# Patient Record
Sex: Female | Born: 1945
Health system: Southern US, Community
[De-identification: ages and names within clinical notes are randomized; demographics above are authoritative.]

## PROBLEM LIST (undated history)

## (undated) DIAGNOSIS — T7840XA Allergy, unspecified, initial encounter: Secondary | ICD-10-CM

## (undated) DIAGNOSIS — K219 Gastro-esophageal reflux disease without esophagitis: Secondary | ICD-10-CM

## (undated) DIAGNOSIS — I10 Essential (primary) hypertension: Secondary | ICD-10-CM

## (undated) DIAGNOSIS — H269 Unspecified cataract: Secondary | ICD-10-CM

## (undated) HISTORY — DX: Allergy, unspecified, initial encounter: T78.40XA

## (undated) HISTORY — DX: Gastro-esophageal reflux disease without esophagitis: K21.9

## (undated) HISTORY — DX: Unspecified cataract: H26.9

## (undated) HISTORY — DX: Essential (primary) hypertension: I10

---

## 1981-06-16 HISTORY — PX: PARTIAL HYSTERECTOMY: SHX80

## 2000-01-23 ENCOUNTER — Encounter: Payer: Self-pay | Admitting: Family Medicine

## 2000-01-23 ENCOUNTER — Encounter: Admission: RE | Admit: 2000-01-23 | Discharge: 2000-01-23 | Payer: Self-pay | Admitting: Family Medicine

## 2001-02-05 ENCOUNTER — Encounter: Payer: Self-pay | Admitting: Family Medicine

## 2001-02-05 ENCOUNTER — Encounter: Admission: RE | Admit: 2001-02-05 | Discharge: 2001-02-05 | Payer: Self-pay | Admitting: Family Medicine

## 2001-11-23 ENCOUNTER — Other Ambulatory Visit: Admission: RE | Admit: 2001-11-23 | Discharge: 2001-11-23 | Payer: Self-pay | Admitting: Family Medicine

## 2002-02-22 ENCOUNTER — Encounter: Admission: RE | Admit: 2002-02-22 | Discharge: 2002-02-22 | Payer: Self-pay | Admitting: Family Medicine

## 2002-02-22 ENCOUNTER — Encounter: Payer: Self-pay | Admitting: Family Medicine

## 2003-01-13 ENCOUNTER — Other Ambulatory Visit: Admission: RE | Admit: 2003-01-13 | Discharge: 2003-01-13 | Payer: Self-pay | Admitting: Family Medicine

## 2003-02-28 ENCOUNTER — Encounter: Payer: Self-pay | Admitting: Family Medicine

## 2003-02-28 ENCOUNTER — Encounter: Admission: RE | Admit: 2003-02-28 | Discharge: 2003-02-28 | Payer: Self-pay | Admitting: Family Medicine

## 2004-03-06 ENCOUNTER — Ambulatory Visit (HOSPITAL_COMMUNITY): Admission: RE | Admit: 2004-03-06 | Discharge: 2004-03-06 | Payer: Self-pay | Admitting: Family Medicine

## 2005-04-01 ENCOUNTER — Encounter: Admission: RE | Admit: 2005-04-01 | Discharge: 2005-04-01 | Payer: Self-pay | Admitting: Family Medicine

## 2005-04-04 ENCOUNTER — Encounter: Admission: RE | Admit: 2005-04-04 | Discharge: 2005-04-04 | Payer: Self-pay | Admitting: Family Medicine

## 2006-05-19 ENCOUNTER — Encounter: Admission: RE | Admit: 2006-05-19 | Discharge: 2006-05-19 | Payer: Self-pay | Admitting: Family Medicine

## 2007-04-28 ENCOUNTER — Encounter: Admission: RE | Admit: 2007-04-28 | Discharge: 2007-04-28 | Payer: Self-pay | Admitting: Family Medicine

## 2007-05-17 LAB — CONVERTED CEMR LAB

## 2007-07-20 ENCOUNTER — Encounter: Admission: RE | Admit: 2007-07-20 | Discharge: 2007-07-20 | Payer: Self-pay | Admitting: Family Medicine

## 2008-05-23 ENCOUNTER — Encounter: Payer: Self-pay | Admitting: Family Medicine

## 2009-01-03 ENCOUNTER — Ambulatory Visit: Payer: Self-pay | Admitting: Family Medicine

## 2009-01-03 DIAGNOSIS — K219 Gastro-esophageal reflux disease without esophagitis: Secondary | ICD-10-CM | POA: Insufficient documentation

## 2009-01-03 DIAGNOSIS — Z860100 Personal history of colon polyps, unspecified: Secondary | ICD-10-CM | POA: Insufficient documentation

## 2009-01-03 DIAGNOSIS — J309 Allergic rhinitis, unspecified: Secondary | ICD-10-CM | POA: Insufficient documentation

## 2009-01-03 DIAGNOSIS — L280 Lichen simplex chronicus: Secondary | ICD-10-CM

## 2009-01-03 DIAGNOSIS — I10 Essential (primary) hypertension: Secondary | ICD-10-CM | POA: Insufficient documentation

## 2009-01-08 ENCOUNTER — Encounter: Admission: RE | Admit: 2009-01-08 | Discharge: 2009-01-08 | Payer: Self-pay | Admitting: Family Medicine

## 2009-05-24 ENCOUNTER — Ambulatory Visit: Payer: Self-pay | Admitting: Family Medicine

## 2009-05-24 DIAGNOSIS — E7800 Pure hypercholesterolemia, unspecified: Secondary | ICD-10-CM | POA: Insufficient documentation

## 2009-05-25 LAB — CONVERTED CEMR LAB
Albumin: 3.2 g/dL — ABNORMAL LOW (ref 3.5–5.2)
Alkaline Phosphatase: 49 units/L (ref 39–117)
Bilirubin, Direct: 0.1 mg/dL (ref 0.0–0.3)
CO2: 30 meq/L (ref 19–32)
Calcium: 8.9 mg/dL (ref 8.4–10.5)
Creatinine, Ser: 0.8 mg/dL (ref 0.4–1.2)
GFR calc non Af Amer: 76.81 mL/min (ref >60)
Glucose, Bld: 98 mg/dL (ref 70–99)
HDL: 53.5 mg/dL (ref >39.00)
Sodium: 141 meq/L (ref 135–145)
Total Protein: 6.5 g/dL (ref 6.0–8.3)
Triglycerides: 124 mg/dL (ref 0.0–149.0)
VLDL: 24.8 mg/dL (ref 0.0–40.0)

## 2009-06-19 ENCOUNTER — Ambulatory Visit: Payer: Self-pay | Admitting: Family Medicine

## 2009-06-19 LAB — HM PAP SMEAR

## 2009-06-19 LAB — CONVERTED CEMR LAB

## 2009-11-23 ENCOUNTER — Ambulatory Visit: Payer: Self-pay | Admitting: Family Medicine

## 2009-11-23 LAB — CONVERTED CEMR LAB
Albumin: 3.7 g/dL (ref 3.5–5.2)
BUN: 17 mg/dL (ref 6–23)
Calcium: 9.5 mg/dL (ref 8.4–10.5)
Cholesterol: 166 mg/dL (ref 0–200)
Creatinine, Ser: 0.8 mg/dL (ref 0.4–1.2)
GFR calc non Af Amer: 77.81 mL/min (ref >60)
Glucose, Bld: 98 mg/dL (ref 70–99)
HDL: 44.8 mg/dL (ref >39.00)
LDL Cholesterol: 81 mg/dL (ref 0–99)
Total Bilirubin: 0.6 mg/dL (ref 0.3–1.2)
VLDL: 40 mg/dL (ref 0.0–40.0)

## 2009-11-30 ENCOUNTER — Ambulatory Visit: Payer: Self-pay | Admitting: Family Medicine

## 2010-01-28 ENCOUNTER — Encounter: Admission: RE | Admit: 2010-01-28 | Discharge: 2010-01-28 | Payer: Self-pay | Admitting: Family Medicine

## 2010-06-27 ENCOUNTER — Ambulatory Visit
Admission: RE | Admit: 2010-06-27 | Discharge: 2010-06-27 | Payer: Self-pay | Source: Home / Self Care | Attending: Family Medicine | Admitting: Family Medicine

## 2010-06-27 ENCOUNTER — Other Ambulatory Visit: Payer: Self-pay | Admitting: Family Medicine

## 2010-06-27 LAB — BASIC METABOLIC PANEL
BUN: 19 mg/dL (ref 6–23)
CO2: 33 mEq/L — ABNORMAL HIGH (ref 19–32)
Calcium: 9.4 mg/dL (ref 8.4–10.5)
Chloride: 100 mEq/L (ref 96–112)
Creatinine, Ser: 0.9 mg/dL (ref 0.4–1.2)
GFR: 70.42 mL/min (ref >60.00)
Glucose, Bld: 97 mg/dL (ref 70–99)
Potassium: 3.9 mEq/L (ref 3.5–5.1)
Sodium: 138 mEq/L (ref 135–145)

## 2010-06-27 LAB — LIPID PANEL
Cholesterol: 168 mg/dL (ref 0–200)
HDL: 44.9 mg/dL (ref >39.00)
LDL Cholesterol: 88 mg/dL (ref 0–99)
Total CHOL/HDL Ratio: 4
Triglycerides: 177 mg/dL — ABNORMAL HIGH (ref 0.0–149.0)
VLDL: 35.4 mg/dL (ref 0.0–40.0)

## 2010-06-27 LAB — HEPATIC FUNCTION PANEL
ALT: 19 U/L (ref 0–35)
AST: 20 U/L (ref 0–37)
Albumin: 3.6 g/dL (ref 3.5–5.2)
Alkaline Phosphatase: 67 U/L (ref 39–117)
Bilirubin, Direct: 0.1 mg/dL (ref 0.0–0.3)
Total Bilirubin: 0.7 mg/dL (ref 0.3–1.2)
Total Protein: 6.7 g/dL (ref 6.0–8.3)

## 2010-07-09 ENCOUNTER — Ambulatory Visit
Admission: RE | Admit: 2010-07-09 | Discharge: 2010-07-09 | Payer: Self-pay | Source: Home / Self Care | Attending: Family Medicine | Admitting: Family Medicine
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
## 2010-10-03 ENCOUNTER — Other Ambulatory Visit: Payer: Self-pay | Admitting: *Deleted

## 2010-10-03 NOTE — Telephone Encounter (Signed)
Pt is switching mail order pharmacies and needs new written 90 day scripts for estradiol, caduet, and corzide.

## 2010-10-04 MED ORDER — AMLODIPINE-ATORVASTATIN 5-20 MG PO TABS: 1.0000 | ORAL_TABLET | Freq: Every day | ORAL | Status: AC

## 2010-10-04 MED ORDER — NADOLOL-BENDROFLUMETHIAZIDE 40-5 MG PO TABS: 1.0000 | ORAL_TABLET | Freq: Every day | ORAL | Status: DC

## 2010-10-04 MED ORDER — ESTRADIOL 0.5 MG PO TABS: ORAL_TABLET | ORAL | Status: DC

## 2010-12-31 ENCOUNTER — Other Ambulatory Visit: Payer: Self-pay | Admitting: Family Medicine

## 2010-12-31 DIAGNOSIS — E7800 Pure hypercholesterolemia, unspecified: Secondary | ICD-10-CM

## 2011-01-02 ENCOUNTER — Other Ambulatory Visit (INDEPENDENT_AMBULATORY_CARE_PROVIDER_SITE_OTHER): Payer: Medicare Other

## 2011-01-02 DIAGNOSIS — E7800 Pure hypercholesterolemia, unspecified: Secondary | ICD-10-CM

## 2011-01-02 LAB — LIPID PANEL
Cholesterol: 155 mg/dL (ref 0–200)
HDL: 52.5 mg/dL (ref >39.00)
Triglycerides: 142 mg/dL (ref 0.0–149.0)
VLDL: 28.4 mg/dL (ref 0.0–40.0)

## 2011-01-06 ENCOUNTER — Encounter: Payer: Self-pay | Admitting: Family Medicine

## 2011-01-08 ENCOUNTER — Other Ambulatory Visit: Payer: Self-pay | Admitting: Family Medicine

## 2011-01-08 ENCOUNTER — Telehealth: Payer: Self-pay | Admitting: *Deleted

## 2011-01-08 ENCOUNTER — Ambulatory Visit (INDEPENDENT_AMBULATORY_CARE_PROVIDER_SITE_OTHER): Payer: Medicare Other | Admitting: Family Medicine

## 2011-01-08 ENCOUNTER — Encounter: Payer: Self-pay | Admitting: Family Medicine

## 2011-01-08 DIAGNOSIS — M67919 Unspecified disorder of synovium and tendon, unspecified shoulder: Secondary | ICD-10-CM

## 2011-01-08 DIAGNOSIS — M7540 Impingement syndrome of unspecified shoulder: Secondary | ICD-10-CM | POA: Insufficient documentation

## 2011-01-08 DIAGNOSIS — Z780 Asymptomatic menopausal state: Secondary | ICD-10-CM

## 2011-01-08 DIAGNOSIS — E7800 Pure hypercholesterolemia, unspecified: Secondary | ICD-10-CM

## 2011-01-08 DIAGNOSIS — Z1231 Encounter for screening mammogram for malignant neoplasm of breast: Secondary | ICD-10-CM

## 2011-01-08 DIAGNOSIS — I10 Essential (primary) hypertension: Secondary | ICD-10-CM

## 2011-01-08 MED ORDER — DICLOFENAC SODIUM 75 MG PO TBEC: 75.0000 mg | DELAYED_RELEASE_TABLET | Freq: Two times a day (BID) | ORAL | Status: DC

## 2011-01-08 NOTE — Telephone Encounter (Signed)
i went ot enter this but computer say order is already been placed in last 24 hours. Can you look into this cancel order and close note if it is already taken care of.

## 2011-01-08 NOTE — Progress Notes (Signed)
  Subjective:    Patient ID: Claire Lewis, female    DOB: 1946/03/02, 65 y.o.   MRN: [id number]  HPI  Hypertension:  Well controlle don caduet and corzide  Using medication without problems or lightheadedness:  None Chest pain with exertion: None Edema:None Short of breath:None  Average home BPs:117/70 Other issues:  Elevated Cholesterol: LDL at goal , 1300 on lipitor, now triglycerides are also at goal. Using medications without problems:Yes Muscle aches: None Other complaints:   Right shoulder pain x 5-6 months  Pain with lifting arm reaching to back. Dull constant daily ache. No known injury or fall.  No past shoulder issues or surgery. Ibuprofen helps temporarily.   Review of Systems  Constitutional: Negative for fever and fatigue.  HENT: Negative for ear pain.   Eyes: Negative for pain.  Respiratory: Negative for chest tightness and shortness of breath.   Cardiovascular: Negative for chest pain, palpitations and leg swelling.  Gastrointestinal: Negative for abdominal pain.  Genitourinary: Negative for dysuria.       Objective:   Physical Exam  Constitutional: Vital signs are normal. She appears well-developed and well-nourished. She is cooperative.  Non-toxic appearance. She does not appear ill. No distress.  HENT:  Head: Normocephalic.  Right Ear: Hearing, tympanic membrane, external ear and ear canal normal. Tympanic membrane is not erythematous, not retracted and not bulging.  Left Ear: Hearing, tympanic membrane, external ear and ear canal normal. Tympanic membrane is not erythematous, not retracted and not bulging.  Nose: No mucosal edema or rhinorrhea. Right sinus exhibits no maxillary sinus tenderness and no frontal sinus tenderness. Left sinus exhibits no maxillary sinus tenderness and no frontal sinus tenderness.  Mouth/Throat: Uvula is midline, oropharynx is clear and moist and mucous membranes are normal.  Eyes: Conjunctivae, EOM and lids are normal.  Pupils are equal, round, and reactive to light. No foreign bodies found.  Neck: Trachea normal and normal range of motion. Neck supple. Carotid bruit is not present. No mass and no thyromegaly present.  Cardiovascular: Normal rate, regular rhythm, S1 normal, S2 normal, normal heart sounds, intact distal pulses and normal pulses.  Exam reveals no gallop and no friction rub.   No murmur heard. Pulmonary/Chest: Effort normal and breath sounds normal. Not tachypneic. No respiratory distress. She has no decreased breath sounds. She has no wheezes. She has no rhonchi. She has no rales.  Abdominal: Soft. Normal appearance and bowel sounds are normal. There is no tenderness.  Musculoskeletal:       Right shoulder: She exhibits decreased range of motion and tenderness. She exhibits no bony tenderness and no swelling.       Right shoulder: Pain with int and ext rotation and abduction  Positive Neer's impingement sign   Neg Drop arm test.  neg crossover test.  Neurological: She is alert.  Skin: Skin is warm, dry and intact. No rash noted.  Psychiatric: Her speech is normal and behavior is normal. Judgment and thought content normal. Her mood appears not anxious. Cognition and memory are normal. She does not exhibit a depressed mood.          Assessment & Plan:

## 2011-01-08 NOTE — Assessment & Plan Note (Signed)
Well controlled. Continue current medication.  

## 2011-01-08 NOTE — Patient Instructions (Signed)
Likely rotator cuff tendonitis:  Start diclofenac twice daily ... Take daily for 3-4 days with meals then as needed.  Start gentle stretching aexercsies as in Engineering geologist.  Ice. Call in 2 weeks if not improving... Will likely have you see Dr. Patsy Lager for steroid injection at that time.

## 2011-01-08 NOTE — Telephone Encounter (Signed)
[facility] and she needs order sent to them.

## 2011-01-08 NOTE — Assessment & Plan Note (Signed)
NSAIDs, exercsies, ice. Call in 2 weeks if not improving for possible steroid injection.

## 2011-01-08 NOTE — Assessment & Plan Note (Signed)
LDL at goal and triglycerides improved. Well controlled. Continue current medication.

## 2011-01-13 NOTE — Telephone Encounter (Signed)
[facility] and Dexa.

## 2011-01-14 ENCOUNTER — Ambulatory Visit: Payer: Self-pay | Admitting: Family Medicine

## 2011-01-20 ENCOUNTER — Telehealth: Payer: Self-pay | Admitting: *Deleted

## 2011-01-20 MED ORDER — NAPROXEN 500 MG PO TABS: 500.0000 mg | ORAL_TABLET | Freq: Two times a day (BID) | ORAL | Status: DC

## 2011-01-20 NOTE — Telephone Encounter (Signed)
Patient was started on Diclofenac on 01/08/2011 and took the medication with a meal as instructed but her stomach was very upset all week with diarrhea.  Could she be given something not so strong?

## 2011-01-20 NOTE — Telephone Encounter (Signed)
Call  This can happen sometimes with diclofenac (gi upset) Changed to naprosyn, sent to pharm

## 2011-01-20 NOTE — Telephone Encounter (Signed)
Patient advised.

## 2011-01-30 ENCOUNTER — Ambulatory Visit
Admission: RE | Admit: 2011-01-30 | Discharge: 2011-01-30 | Disposition: A | Discharge status: Home / Self Care | Payer: Medicare Other | Source: Ambulatory Visit | Attending: Family Medicine | Admitting: Family Medicine

## 2011-01-30 DIAGNOSIS — Z780 Asymptomatic menopausal state: Secondary | ICD-10-CM

## 2011-01-30 DIAGNOSIS — Z1231 Encounter for screening mammogram for malignant neoplasm of breast: Secondary | ICD-10-CM

## 2011-01-31 ENCOUNTER — Encounter: Payer: Self-pay | Admitting: Family Medicine

## 2011-01-31 NOTE — Telephone Encounter (Signed)
Claire Lewis, this has been taken care of?

## 2011-01-31 NOTE — Telephone Encounter (Signed)
Patient has had the bone density and mammogram as requested.

## 2011-02-07 ENCOUNTER — Encounter: Payer: Self-pay | Admitting: *Deleted

## 2011-04-15 ENCOUNTER — Telehealth: Payer: Self-pay | Admitting: *Deleted

## 2011-04-15 NOTE — Telephone Encounter (Signed)
Pt states nadolol is on manufacturer back order and unable to find, so she needs to change this to something else.  She would want that sent to [facility], prefers generic in something.  She has enough nadolol to last 3 days.

## 2011-04-16 MED ORDER — METOPROLOL SUCCINATE ER 50 MG PO TB24: 50.0000 mg | ORAL_TABLET | Freq: Every day | ORAL | Status: DC

## 2011-04-16 NOTE — Telephone Encounter (Signed)
Patient advised and she has not had any problems in the past and will watch her bp

## 2011-04-16 NOTE — Telephone Encounter (Signed)
For BP control only.. Will cahnge to metoprolol  50 mg daily unless pt has had problem with this med in past.. Please verify. HAve her watch BP closely on new med.

## 2011-06-13 ENCOUNTER — Other Ambulatory Visit: Payer: Self-pay | Admitting: Family Medicine

## 2011-07-02 ENCOUNTER — Telehealth: Payer: Self-pay | Admitting: Family Medicine

## 2011-07-02 DIAGNOSIS — I10 Essential (primary) hypertension: Secondary | ICD-10-CM

## 2011-07-02 DIAGNOSIS — E7800 Pure hypercholesterolemia, unspecified: Secondary | ICD-10-CM

## 2011-07-02 NOTE — Telephone Encounter (Signed)
Message copied by Excell Seltzer on Wed Jul 02, 2011 11:41 PM ------      Message from: Baldomero Lamy      Created: Wed Jun 25, 2011  8:23 AM      Regarding: cpx labs Thurs1/17/13       Please order  future cpx labs for pt's upcomming lab appt.      Thanks      Rodney Booze

## 2011-07-03 ENCOUNTER — Other Ambulatory Visit (INDEPENDENT_AMBULATORY_CARE_PROVIDER_SITE_OTHER): Payer: Medicare Other

## 2011-07-03 DIAGNOSIS — E7800 Pure hypercholesterolemia, unspecified: Secondary | ICD-10-CM

## 2011-07-03 DIAGNOSIS — I10 Essential (primary) hypertension: Secondary | ICD-10-CM

## 2011-07-03 LAB — COMPREHENSIVE METABOLIC PANEL
Albumin: 3.7 g/dL (ref 3.5–5.2)
Alkaline Phosphatase: 66 U/L (ref 39–117)
CO2: 28 mEq/L (ref 19–32)
Calcium: 9.1 mg/dL (ref 8.4–10.5)
Chloride: 106 mEq/L (ref 96–112)
Glucose, Bld: 89 mg/dL (ref 70–99)
Potassium: 3.8 mEq/L (ref 3.5–5.1)
Sodium: 141 mEq/L (ref 135–145)
Total Protein: 6.6 g/dL (ref 6.0–8.3)

## 2011-07-03 LAB — LIPID PANEL
Cholesterol: 149 mg/dL (ref 0–200)
LDL Cholesterol: 71 mg/dL (ref 0–99)

## 2011-07-10 ENCOUNTER — Encounter: Payer: Medicare Other | Admitting: Family Medicine

## 2011-07-14 ENCOUNTER — Encounter: Payer: Medicare Other | Admitting: Family Medicine

## 2011-07-15 ENCOUNTER — Encounter: Payer: Self-pay | Admitting: Family Medicine

## 2011-07-15 ENCOUNTER — Ambulatory Visit (INDEPENDENT_AMBULATORY_CARE_PROVIDER_SITE_OTHER): Payer: Medicare Other | Admitting: Family Medicine

## 2011-07-15 DIAGNOSIS — I10 Essential (primary) hypertension: Secondary | ICD-10-CM

## 2011-07-15 DIAGNOSIS — Z0000 Encounter for general adult medical examination without abnormal findings: Secondary | ICD-10-CM

## 2011-07-15 DIAGNOSIS — E7800 Pure hypercholesterolemia, unspecified: Secondary | ICD-10-CM

## 2011-07-15 MED ORDER — METOPROLOL SUCCINATE ER 50 MG PO TB24: 50.0000 mg | ORAL_TABLET | Freq: Every day | ORAL | Status: DC

## 2011-07-15 NOTE — Assessment & Plan Note (Signed)
Well controlled. Continue current medication, atorvastatin 20 mg daily. No SE.

## 2011-07-15 NOTE — Assessment & Plan Note (Signed)
Well controlled. Continue current medication.  

## 2011-07-15 NOTE — Progress Notes (Signed)
Subjective:    Patient ID: Claire Lewis, female    DOB: 12/07/1945, 66 y.o.   MRN: [id number]  HPI  I have personally reviewed the Medicare Annual Wellness questionnaire and have noted 1. The patient's medical and social history 2. Their use of alcohol, tobacco or illicit drugs 3. Their current medications and supplements 4. The patient's functional ability including ADL's, fall risks, home safety risks and hearing or visual             impairment. 5. Diet and physical activities 6. Evidence for depression or mood disorders  The patients weight, height, BMI and visual acuity have been recorded in the chart I have made referrals, counseling and provided education to the patient based review of the above and I have provided the pt with a written personalized care plan for preventive services.  Hypertension:   well controlled on caduet and metoprolol  Using medication without problems or lightheadedness:  HR on low side but pt asymptomatic. She will call if lightheaded Chest pain with exertion:None Edema:None Short of breath:None Average home BPs: 120-130/60-70 at home Other issues:  Elevated Cholesterol: Well controlled on on caduet Using medications without problems: None Muscle aches: None Diet compliance: Great .. ahs lost 12 lbs in last year on weight watchers. 50 lbs lost in last several years. Exercise: 3-4 times a week. Other complaints:   Review of Systems  Constitutional: Negative for fever, fatigue and unexpected weight change.  HENT: Negative for ear pain, congestion, sore throat, sneezing, trouble swallowing and sinus pressure.   Eyes: Negative for pain and itching.  Respiratory: Negative for cough, shortness of breath and wheezing.   Cardiovascular: Negative for chest pain, palpitations and leg swelling.  Gastrointestinal: Negative for nausea, abdominal pain, diarrhea, constipation and blood in stool.  Genitourinary: Negative for dysuria, hematuria, vaginal  discharge, difficulty urinating and menstrual problem.  Musculoskeletal:       Right Rotator cuff issue, improved with steroid shot with Dr. Amie Critchley in Nov 2012, still some pain with ROM.  Skin: Negative for rash.  Neurological: Negative for syncope, weakness, light-headedness, numbness and headaches.  Psychiatric/Behavioral: Negative for confusion and dysphoric mood. The patient is not nervous/anxious.        Objective:   Physical Exam  Constitutional: Vital signs are normal. She appears well-developed and well-nourished. She is cooperative.  Non-toxic appearance. She does not appear ill. No distress.  HENT:  Head: Normocephalic.  Right Ear: Hearing, tympanic membrane, external ear and ear canal normal.  Left Ear: Hearing, tympanic membrane, external ear and ear canal normal.  Nose: Nose normal.  Eyes: Conjunctivae, EOM and lids are normal. Pupils are equal, round, and reactive to light. No foreign bodies found.  Neck: Trachea normal and normal range of motion. Neck supple. Carotid bruit is not present. No mass and no thyromegaly present.  Cardiovascular: Normal rate, regular rhythm, S1 normal, S2 normal, normal heart sounds and intact distal pulses.  Exam reveals no gallop.   No murmur heard. Pulmonary/Chest: Effort normal and breath sounds normal. No respiratory distress. She has no wheezes. She has no rhonchi. She has no rales.  Abdominal: Soft. Normal appearance and bowel sounds are normal. She exhibits no distension, no fluid wave, no abdominal bruit and no mass. There is no hepatosplenomegaly. There is no tenderness. There is no rebound, no guarding and no CVA tenderness. No hernia.  Genitourinary: No breast swelling, tenderness, discharge or bleeding. Pelvic exam was performed with patient prone.  NO DVE done  Lymphadenopathy:    She has no cervical adenopathy.    She has no axillary adenopathy.  Neurological: She is alert. She has normal strength. No cranial nerve deficit  or sensory deficit.  Skin: Skin is warm, dry and intact. No rash noted.  Psychiatric: Her speech is normal and behavior is normal. Judgment normal. Her mood appears not anxious. Cognition and memory are normal. She does not exhibit a depressed mood.          Assessment & Plan:  Annual Medicare Wellness: The patient's preventative maintenance and recommended screening tests for an annual wellness exam were reviewed in full today. Brought up to date unless services declined.  Counselled on the importance of diet, exercise, and its role in overall health and mortality. The patient's FH and SH was reviewed, including their home life, tobacco status, and drug and alcohol status.   Vaccines: Uptodate with flu, Td, shingles, offered PNA vaccine but pt declined at this time. DEXA: stable 2012, repeat in 2014, On HRT. Mammogram: Nml 01/2011 DVE/PAP : TAH but has one ovary... Will check DVE every 2- 3 years. No symptoms, no family histroy of ovarian cancer. Nml DVE exam last year... Not performed today. Colon:05/2007 polyps, repeat planned in 05/2012 or early 2014.

## 2011-07-24 ENCOUNTER — Encounter: Payer: Medicare Other | Admitting: Family Medicine

## 2011-10-08 ENCOUNTER — Other Ambulatory Visit: Payer: Self-pay | Admitting: Family Medicine

## 2011-12-23 ENCOUNTER — Encounter: Payer: Medicare Other | Admitting: Family Medicine

## 2011-12-31 ENCOUNTER — Other Ambulatory Visit: Payer: Self-pay | Admitting: Family Medicine

## 2011-12-31 DIAGNOSIS — Z1231 Encounter for screening mammogram for malignant neoplasm of breast: Secondary | ICD-10-CM

## 2012-01-01 ENCOUNTER — Other Ambulatory Visit: Payer: Self-pay | Admitting: Family Medicine

## 2012-02-04 ENCOUNTER — Ambulatory Visit: Payer: Medicare Other

## 2012-03-04 ENCOUNTER — Ambulatory Visit
Admission: RE | Admit: 2012-03-04 | Discharge: 2012-03-04 | Disposition: A | Discharge status: Home / Self Care | Payer: Medicare Other | Source: Ambulatory Visit | Attending: Family Medicine | Admitting: Family Medicine

## 2012-03-04 DIAGNOSIS — Z1231 Encounter for screening mammogram for malignant neoplasm of breast: Secondary | ICD-10-CM | POA: Diagnosis not present

## 2012-04-09 DIAGNOSIS — Z23 Encounter for immunization: Secondary | ICD-10-CM | POA: Diagnosis not present

## 2012-07-06 ENCOUNTER — Other Ambulatory Visit: Payer: Self-pay | Admitting: *Deleted

## 2012-07-06 MED ORDER — ESTRADIOL 0.5 MG PO TABS: 0.5000 mg | ORAL_TABLET | ORAL | Status: DC

## 2012-07-06 MED ORDER — AMLODIPINE-ATORVASTATIN 5-20 MG PO TABS: 1.0000 | ORAL_TABLET | Freq: Every day | ORAL | Status: DC

## 2012-07-06 MED ORDER — METOPROLOL SUCCINATE ER 50 MG PO TB24: 50.0000 mg | ORAL_TABLET | Freq: Every day | ORAL | Status: DC

## 2012-08-12 ENCOUNTER — Telehealth: Payer: Self-pay | Admitting: Family Medicine

## 2012-08-12 ENCOUNTER — Other Ambulatory Visit (INDEPENDENT_AMBULATORY_CARE_PROVIDER_SITE_OTHER): Payer: Medicare Other

## 2012-08-12 DIAGNOSIS — E7800 Pure hypercholesterolemia, unspecified: Secondary | ICD-10-CM | POA: Diagnosis not present

## 2012-08-12 DIAGNOSIS — I10 Essential (primary) hypertension: Secondary | ICD-10-CM

## 2012-08-12 LAB — COMPREHENSIVE METABOLIC PANEL
CO2: 28 mEq/L (ref 19–32)
Calcium: 9.3 mg/dL (ref 8.4–10.5)
Chloride: 105 mEq/L (ref 96–112)
Creatinine, Ser: 0.8 mg/dL (ref 0.4–1.2)
GFR: 79.48 mL/min (ref >60.00)
Glucose, Bld: 101 mg/dL — ABNORMAL HIGH (ref 70–99)
Sodium: 141 mEq/L (ref 135–145)
Total Bilirubin: 0.7 mg/dL (ref 0.3–1.2)
Total Protein: 7.1 g/dL (ref 6.0–8.3)

## 2012-08-12 LAB — LIPID PANEL
Cholesterol: 155 mg/dL (ref 0–200)
VLDL: 16 mg/dL (ref 0.0–40.0)

## 2012-08-12 NOTE — Telephone Encounter (Signed)
Message copied by Kerby Nora E on Thu Aug 12, 2012  8:21 AM ------      Message from: [place], [place] J      Created: Wed Aug 04, 2012 11:23 AM      Regarding: Lab orders for Thursday, 2.27.14       Patient is scheduled for CPX labs, please order future labs, Thanks , Terri       ------

## 2012-08-20 ENCOUNTER — Encounter: Payer: Medicare Other | Admitting: Family Medicine

## 2012-08-24 ENCOUNTER — Encounter: Payer: Self-pay | Admitting: Family Medicine

## 2012-08-24 ENCOUNTER — Ambulatory Visit (INDEPENDENT_AMBULATORY_CARE_PROVIDER_SITE_OTHER): Payer: Medicare Other | Admitting: Family Medicine

## 2012-08-24 VITALS — BP 130/70 | HR 56 | Temp 98.5°F | Ht 67.5 in | Wt 189.0 lb

## 2012-08-24 DIAGNOSIS — I10 Essential (primary) hypertension: Secondary | ICD-10-CM

## 2012-08-24 NOTE — Assessment & Plan Note (Signed)
Well controlled. Continue current medication.  

## 2012-08-24 NOTE — Progress Notes (Signed)
I have personally reviewed the Medicare Annual Wellness questionnaire and have noted  1. The patient's medical and social history  2. Their use of alcohol, tobacco or illicit drugs  3. Their current medications and supplements  4. The patient's functional ability including ADL's, fall risks, home safety risks and hearing or visual  impairment.  5. Diet and physical activities  6. Evidence for depression or mood disorders  The patients weight, height, BMI and visual acuity have been recorded in the chart  I have made referrals, counseling and provided education to the patient based review of the above and I have provided the pt with a written personalized care plan for preventive services.   Hypertension: well controlled on caduet and metoprolol  Using medication without problems or lightheadedness: HR on low side but pt asymptomatic. She will call if lightheaded  Chest pain with exertion:None  Edema:None  Short of breath:None  Average home BPs: 120-130/60-70 at home  Other issues:   Elevated Cholesterol: Well controlled on on caduet  Using medications without problems: None  Muscle aches: None  Diet compliance: Great .Marland Kitchen Still on weight watchers. 50 lbs lost in last several years.  Wt Readings from Last 3 Encounters:  08/24/12 189 lb (85.73 kg)  07/15/11 190 lb 6.4 oz (86.365 kg)  01/08/11 195 lb 8 oz (88.678 kg)  Exercise: 3-4 times a week.  Other complaints:   Review of Systems  Constitutional: Negative for fever, fatigue and unexpected weight change.  HENT: Negative for ear pain, congestion, sore throat, sneezing, trouble swallowing and sinus pressure.  Eyes: Negative for pain and itching.  Respiratory: Negative for cough, shortness of breath and wheezing.  Cardiovascular: Negative for chest pain, palpitations and leg swelling.  Gastrointestinal: Negative for nausea, abdominal pain, diarrhea, constipation and blood in stool.  Genitourinary: Negative for dysuria, hematuria,  vaginal discharge, difficulty urinating and menstrual problem.  Musculoskeletal:  occ right shoulder pain. Skin: Negative for rash.  Neurological: Negative for syncope, weakness, light-headedness, numbness and headaches.  Psychiatric/Behavioral: Negative for confusion and dysphoric mood. The patient is not nervous/anxious.  Objective:   Physical Exam  Constitutional: Vital signs are normal. She appears well-developed and well-nourished. She is cooperative. Non-toxic appearance. She does not appear ill. No distress.  HENT:  Head: Normocephalic.  Right Ear: Hearing, tympanic membrane, external ear and ear canal normal.  Left Ear: Hearing, tympanic membrane, external ear and ear canal normal.  Nose: Nose normal.  Eyes: Conjunctivae, EOM and lids are normal. Pupils are equal, round, and reactive to light. No foreign bodies found.  Neck: Trachea normal and normal range of motion. Neck supple. Carotid bruit is not present. No mass and no thyromegaly present.  Cardiovascular: Normal rate, regular rhythm, S1 normal, S2 normal, normal heart sounds and intact distal pulses. Exam reveals no gallop.  No murmur heard.  Pulmonary/Chest: Effort normal and breath sounds normal. No respiratory distress. She has no wheezes. She has no rhonchi. She has no rales.  Abdominal: Soft. Normal appearance and bowel sounds are normal. She exhibits no distension, no fluid wave, no abdominal bruit and no mass. There is no hepatosplenomegaly. There is no tenderness. There is no rebound, no guarding and no CVA tenderness. No hernia.  Genitourinary: No breast swelling, tenderness, discharge or bleeding. Pelvic exam was performed with patient prone.  DVE: Lymphadenopathy:  She has no cervical adenopathy.  She has no axillary adenopathy.  Neurological: She is alert. She has normal strength. No cranial nerve deficit or sensory deficit.  Skin: Skin is warm, dry and intact. No rash noted.  Psychiatric: Her speech is normal and  behavior is normal. Judgment normal. Her mood appears not anxious. Cognition and memory are normal. She does not exhibit a depressed mood.  Assessment & Plan:   Annual Medicare Wellness: The patient's preventative maintenance and recommended screening tests for an annual wellness exam were reviewed in full today.  Brought up to date unless services declined.  Counselled on the importance of diet, exercise, and its role in overall health and mortality.  The patient's FH and SH was reviewed, including their home life, tobacco status, and drug and alcohol status.   Vaccines: Uptodate with flu, Td, shingles, offered PNA vaccine but pt declined at this time.  DEXA: stable, normal 2012, repeat in 2017, On HRT.  Mammogram: nml 12/2011 DVE/PAP : TAH but has one ovary... Will check DVE every 2- 3 years. No symptoms, no family histroy of ovarian cancer. Nml DVE exam 2012.Marland Kitchen Repeat today Colon:05/2007 polyps, repeat planned in 05/2012 or early 2014... Scheduled with Dr. Elnoria Howard.

## 2012-08-24 NOTE — Patient Instructions (Addendum)
Try to increase exercise or changing type of exercise. Let us know about pnemonia vaccine. Keep appt with Dr. Elnoria Howard for colonoscopy. Set up living will and HCPOA when able.

## 2012-09-07 ENCOUNTER — Other Ambulatory Visit: Payer: Self-pay | Admitting: *Deleted

## 2012-09-07 MED ORDER — METOPROLOL SUCCINATE ER 50 MG PO TB24: 50.0000 mg | ORAL_TABLET | Freq: Every day | ORAL | Status: DC

## 2012-09-07 MED ORDER — AMLODIPINE-ATORVASTATIN 5-20 MG PO TABS: 1.0000 | ORAL_TABLET | Freq: Every day | ORAL | Status: DC

## 2012-09-08 ENCOUNTER — Other Ambulatory Visit: Payer: Self-pay | Admitting: *Deleted

## 2012-09-08 MED ORDER — ESTRADIOL 0.5 MG PO TABS: 0.5000 mg | ORAL_TABLET | ORAL | Status: DC

## 2012-10-28 DIAGNOSIS — Z1211 Encounter for screening for malignant neoplasm of colon: Secondary | ICD-10-CM | POA: Diagnosis not present

## 2012-10-28 DIAGNOSIS — Z86010 Personal history of colonic polyps: Secondary | ICD-10-CM | POA: Diagnosis not present

## 2012-10-28 DIAGNOSIS — D126 Benign neoplasm of colon, unspecified: Secondary | ICD-10-CM | POA: Diagnosis not present

## 2012-10-28 DIAGNOSIS — K649 Unspecified hemorrhoids: Secondary | ICD-10-CM | POA: Diagnosis not present

## 2012-10-28 DIAGNOSIS — K5730 Diverticulosis of large intestine without perforation or abscess without bleeding: Secondary | ICD-10-CM | POA: Diagnosis not present

## 2012-10-28 LAB — HM COLONOSCOPY

## 2013-02-01 ENCOUNTER — Other Ambulatory Visit: Payer: Self-pay

## 2013-02-01 DIAGNOSIS — Z1231 Encounter for screening mammogram for malignant neoplasm of breast: Secondary | ICD-10-CM

## 2013-03-15 ENCOUNTER — Ambulatory Visit
Admission: RE | Admit: 2013-03-15 | Discharge: 2013-03-15 | Disposition: A | Discharge status: Home / Self Care | Payer: Medicare Other | Source: Ambulatory Visit

## 2013-03-15 DIAGNOSIS — Z1231 Encounter for screening mammogram for malignant neoplasm of breast: Secondary | ICD-10-CM | POA: Diagnosis not present

## 2013-03-16 DIAGNOSIS — M6530 Trigger finger, unspecified finger: Secondary | ICD-10-CM | POA: Diagnosis not present

## 2013-03-18 DIAGNOSIS — Z23 Encounter for immunization: Secondary | ICD-10-CM | POA: Diagnosis not present

## 2013-04-13 DIAGNOSIS — M6530 Trigger finger, unspecified finger: Secondary | ICD-10-CM | POA: Diagnosis not present

## 2013-09-26 ENCOUNTER — Telehealth: Payer: Self-pay | Admitting: Family Medicine

## 2013-09-26 DIAGNOSIS — E7800 Pure hypercholesterolemia, unspecified: Secondary | ICD-10-CM

## 2013-09-26 NOTE — Telephone Encounter (Signed)
Message copied by Jinny Sanders on Mon Sep 26, 2013 10:55 PM ------      Message from: Ellamae Sia      Created: Thu Sep 15, 2013 12:10 PM      Regarding: Lab orders for Tuesday, 4.14.15       Patient is scheduled for CPX labs, please order future labs, Thanks , Terri       ------

## 2013-09-27 ENCOUNTER — Other Ambulatory Visit (INDEPENDENT_AMBULATORY_CARE_PROVIDER_SITE_OTHER): Payer: Medicare Other

## 2013-09-27 DIAGNOSIS — E7800 Pure hypercholesterolemia, unspecified: Secondary | ICD-10-CM

## 2013-09-27 LAB — COMPREHENSIVE METABOLIC PANEL
ALT: 19 U/L (ref 0–35)
AST: 19 U/L (ref 0–37)
Albumin: 3.7 g/dL (ref 3.5–5.2)
Alkaline Phosphatase: 62 U/L (ref 39–117)
BILIRUBIN TOTAL: 0.5 mg/dL (ref 0.3–1.2)
BUN: 17 mg/dL (ref 6–23)
CALCIUM: 9.2 mg/dL (ref 8.4–10.5)
CHLORIDE: 108 meq/L (ref 96–112)
CO2: 25 meq/L (ref 19–32)
CREATININE: 0.7 mg/dL (ref 0.4–1.2)
GFR: 84.24 mL/min (ref >60.00)
Glucose, Bld: 97 mg/dL (ref 70–99)
Potassium: 3.9 mEq/L (ref 3.5–5.1)
Sodium: 140 mEq/L (ref 135–145)
Total Protein: 6.8 g/dL (ref 6.0–8.3)

## 2013-09-27 LAB — LIPID PANEL
CHOLESTEROL: 151 mg/dL (ref 0–200)
HDL: 48.3 mg/dL (ref >39.00)
LDL Cholesterol: 74 mg/dL (ref 0–99)
TRIGLYCERIDES: 142 mg/dL (ref 0.0–149.0)
Total CHOL/HDL Ratio: 3
VLDL: 28.4 mg/dL (ref 0.0–40.0)

## 2013-09-29 ENCOUNTER — Other Ambulatory Visit: Payer: Self-pay | Admitting: *Deleted

## 2013-09-29 MED ORDER — AMLODIPINE-ATORVASTATIN 5-20 MG PO TABS: 1.0000 | ORAL_TABLET | Freq: Every day | ORAL | Status: DC

## 2013-09-29 MED ORDER — METOPROLOL SUCCINATE ER 50 MG PO TB24: 50.0000 mg | ORAL_TABLET | Freq: Every day | ORAL | Status: DC

## 2013-09-29 MED ORDER — ESTRADIOL 0.5 MG PO TABS: 0.5000 mg | ORAL_TABLET | ORAL | Status: DC

## 2013-10-04 ENCOUNTER — Ambulatory Visit (INDEPENDENT_AMBULATORY_CARE_PROVIDER_SITE_OTHER): Payer: Medicare Other | Admitting: Family Medicine

## 2013-10-04 ENCOUNTER — Encounter: Payer: Self-pay | Admitting: Family Medicine

## 2013-10-04 VITALS — BP 126/64 | HR 56 | Temp 98.0°F | Ht 65.5 in | Wt 198.0 lb

## 2013-10-04 DIAGNOSIS — Z0000 Encounter for general adult medical examination without abnormal findings: Secondary | ICD-10-CM

## 2013-10-04 DIAGNOSIS — E7800 Pure hypercholesterolemia, unspecified: Secondary | ICD-10-CM

## 2013-10-04 DIAGNOSIS — Z23 Encounter for immunization: Secondary | ICD-10-CM

## 2013-10-04 DIAGNOSIS — I10 Essential (primary) hypertension: Secondary | ICD-10-CM

## 2013-10-04 NOTE — Patient Instructions (Addendum)
Given PNA  Vaccine today. Schedule mammogram in 01/2014. Work on low [employer], healthy eating and weight loss.

## 2013-10-04 NOTE — Progress Notes (Signed)
Pre visit review using our clinic review tool, if applicable. No additional management support is needed unless otherwise documented below in the visit note. 

## 2013-10-04 NOTE — Assessment & Plan Note (Signed)
Well controlled. Continue current medication.  

## 2013-10-04 NOTE — Assessment & Plan Note (Signed)
Well controlled 

## 2013-10-04 NOTE — Progress Notes (Signed)
I have personally reviewed the Medicare Annual Wellness questionnaire and have noted  1. The patient's medical and social history  2. Their use of alcohol, tobacco or illicit drugs  3. Their current medications and supplements  4. The patient's functional ability including ADL's, fall risks, home safety risks and hearing or visual  impairment.  5. Diet and physical activities  6. Evidence for depression or mood disorders  The patients weight, height, BMI and visual acuity have been recorded in the chart  I have made referrals, counseling and provided education to the patient based review of the above and I have provided the pt with a written personalized care plan for preventive services.   Hypertension: well controlled on caduet and metoprolol  BP Readings from Last 3 Encounters:  10/04/13 126/64  08/24/12 130/70  07/15/11 140/64  Using medication without problems or lightheadedness: HR on low side but pt asymptomatic. She will call if lightheaded  Chest pain with exertion:None  Edema:None  Short of breath:None  Average home BPs: 120-130/60-70 at home  Other issues:   Elevated Cholesterol: Well controlled on on caduet  Lab Results  Component Value Date   CHOL 151 09/27/2013   HDL 48.30 09/27/2013   LDLCALC 74 09/27/2013   TRIG 142.0 09/27/2013   CHOLHDL 3 09/27/2013  Using medications without problems: None  Muscle aches: None  Diet compliance: Great .Marland Kitchen Still on weight watchers.  Some weight gain back.  Wt Readings from Last 3 Encounters:  10/04/13 198 lb (89.812 kg)  08/24/12 189 lb (85.73 kg)  07/15/11 190 lb 6.4 oz (86.365 kg)  Exercise:  Walking 3-4 times a week.  Other complaints:   Review of Systems  Constitutional: Negative for fever, fatigue and unexpected weight change.  HENT: Negative for ear pain, congestion, sore throat, sneezing, trouble swallowing and sinus pressure.  Eyes: Negative for pain and itching.  Respiratory: Negative for cough, shortness of breath and  wheezing.  Cardiovascular: Negative for chest pain, palpitations and leg swelling.  Gastrointestinal: Negative for nausea, abdominal pain, diarrhea, constipation and blood in stool.  Genitourinary: Negative for dysuria, hematuria, vaginal discharge, difficulty urinating and menstrual problem.  Musculoskeletal: occ right shoulder pain.  Skin: Negative for rash.  Neurological: Negative for syncope, weakness, light-headedness, numbness and headaches.  Psychiatric/Behavioral: Negative for confusion and dysphoric mood. The patient is not nervous/anxious.  Objective:   Physical Exam  Constitutional: Vital signs are normal. She appears well-developed and well-nourished. She is cooperative. Non-toxic appearance. She does not appear ill. No distress.  HENT:  Head: Normocephalic.  Right Ear: Hearing, tympanic membrane, external ear and ear canal normal.  Left Ear: Hearing, tympanic membrane, external ear and ear canal normal.  Nose: Nose normal.  Eyes: Conjunctivae, EOM and lids are normal. Pupils are equal, round, and reactive to light. No foreign bodies found.  Neck: Trachea normal and normal range of motion. Neck supple. Carotid bruit is not present. No mass and no thyromegaly present.  Cardiovascular: Normal rate, regular rhythm, S1 normal, S2 normal, normal heart sounds and intact distal pulses. Exam reveals no gallop.  No murmur heard.  Pulmonary/Chest: Effort normal and breath sounds normal. No respiratory distress. She has no wheezes. She has no rhonchi. She has no rales.  Abdominal: Soft. Normal appearance and bowel sounds are normal. She exhibits no distension, no fluid wave, no abdominal bruit and no mass. There is no hepatosplenomegaly. There is no tenderness. There is no rebound, no guarding and no CVA tenderness. No hernia.  Genitourinary:  No breast swelling, tenderness, discharge or bleeding. JME:[id number]. Lymphadenopathy:  She has no cervical adenopathy.  She has no axillary  adenopathy.  Neurological: She is alert. She has normal strength. No cranial nerve deficit or sensory deficit.  Skin: Skin is warm, dry and intact. No rash noted.  Psychiatric: Her speech is normal and behavior is normal. Judgment normal. Her mood appears not anxious. Cognition and memory are normal. She does not exhibit a depressed mood.  Assessment & Plan:   Annual Medicare Wellness: The patient's preventative maintenance and recommended screening tests for an annual wellness exam were reviewed in full today.  Brought up to date unless services declined.  Counselled on the importance of diet, exercise, and its role in overall health and mortality.  The patient's FH and SH was reviewed, including their home life, tobacco status, and drug and alcohol status.   Vaccines: Uptodate with flu, Td, shingles, due for PNA. DEXA: stable, normal 2012, repeat in 2017, On HRT.  Mammogram: nml 01/2013  DVE/PAP : TAH but has one ovary... Will check DVE every 2- 3 years. No symptoms, no family histroy of ovarian cancer. Nml DVE exam 2014. Colon:05/2013 polyps, family history, repeat in 5 years, Dr. Benson Norway.  nonsmoker

## 2013-10-05 ENCOUNTER — Telehealth: Payer: Self-pay | Admitting: Family Medicine

## 2013-10-05 NOTE — Telephone Encounter (Signed)
Relevant patient education assigned to patient using Emmi. ° °

## 2013-10-13 DIAGNOSIS — M6530 Trigger finger, unspecified finger: Secondary | ICD-10-CM | POA: Diagnosis not present

## 2013-12-27 ENCOUNTER — Other Ambulatory Visit: Payer: Self-pay | Admitting: *Deleted

## 2013-12-27 MED ORDER — AMLODIPINE-ATORVASTATIN 5-20 MG PO TABS: 1.0000 | ORAL_TABLET | Freq: Every day | ORAL | Status: DC

## 2014-02-13 ENCOUNTER — Other Ambulatory Visit: Payer: Self-pay

## 2014-02-13 DIAGNOSIS — Z1231 Encounter for screening mammogram for malignant neoplasm of breast: Secondary | ICD-10-CM

## 2014-03-16 ENCOUNTER — Ambulatory Visit
Admission: RE | Admit: 2014-03-16 | Discharge: 2014-03-16 | Disposition: A | Discharge status: Home / Self Care | Payer: Medicare Other | Source: Ambulatory Visit

## 2014-03-16 ENCOUNTER — Encounter (INDEPENDENT_AMBULATORY_CARE_PROVIDER_SITE_OTHER): Payer: Self-pay

## 2014-03-16 DIAGNOSIS — Z1231 Encounter for screening mammogram for malignant neoplasm of breast: Secondary | ICD-10-CM | POA: Diagnosis not present

## 2014-04-11 DIAGNOSIS — M65341 Trigger finger, right ring finger: Secondary | ICD-10-CM | POA: Diagnosis not present

## 2014-06-27 ENCOUNTER — Other Ambulatory Visit: Payer: Self-pay | Admitting: Family Medicine

## 2014-09-25 ENCOUNTER — Other Ambulatory Visit: Payer: Self-pay | Admitting: Family Medicine

## 2014-10-29 ENCOUNTER — Telehealth: Payer: Self-pay | Admitting: Family Medicine

## 2014-10-29 DIAGNOSIS — E7800 Pure hypercholesterolemia, unspecified: Secondary | ICD-10-CM

## 2014-10-29 NOTE — Telephone Encounter (Signed)
-----   Message from Ellamae Sia sent at 10/26/2014  5:04 PM EDT ----- Regarding: Lab orders for Monday, 5.16.16 Patient is scheduled for CPX labs, please order future labs, Thanks , Karna Christmas

## 2014-10-31 ENCOUNTER — Other Ambulatory Visit (INDEPENDENT_AMBULATORY_CARE_PROVIDER_SITE_OTHER): Payer: Medicare Other

## 2014-10-31 DIAGNOSIS — E7800 Pure hypercholesterolemia, unspecified: Secondary | ICD-10-CM

## 2014-10-31 LAB — COMPREHENSIVE METABOLIC PANEL
ALT: 16 U/L (ref 0–35)
AST: 20 U/L (ref 0–37)
Albumin: 4 g/dL (ref 3.5–5.2)
Alkaline Phosphatase: 69 U/L (ref 39–117)
BILIRUBIN TOTAL: 0.6 mg/dL (ref 0.2–1.2)
BUN: 16 mg/dL (ref 6–23)
CALCIUM: 9.5 mg/dL (ref 8.4–10.5)
CO2: 29 mEq/L (ref 19–32)
Chloride: 106 mEq/L (ref 96–112)
Creatinine, Ser: 0.8 mg/dL (ref 0.40–1.20)
GFR: 75.55 mL/min (ref >60.00)
Glucose, Bld: 100 mg/dL — ABNORMAL HIGH (ref 70–99)
Potassium: 4.4 mEq/L (ref 3.5–5.1)
Sodium: 140 mEq/L (ref 135–145)
Total Protein: 6.7 g/dL (ref 6.0–8.3)

## 2014-10-31 LAB — LIPID PANEL
Cholesterol: 160 mg/dL (ref 0–200)
HDL: 51.2 mg/dL (ref >39.00)
LDL CALC: 82 mg/dL (ref 0–99)
NonHDL: 108.8
TRIGLYCERIDES: 134 mg/dL (ref 0.0–149.0)
Total CHOL/HDL Ratio: 3
VLDL: 26.8 mg/dL (ref 0.0–40.0)

## 2014-11-07 ENCOUNTER — Ambulatory Visit (INDEPENDENT_AMBULATORY_CARE_PROVIDER_SITE_OTHER): Payer: Medicare Other | Admitting: Family Medicine

## 2014-11-07 ENCOUNTER — Encounter: Payer: Self-pay | Admitting: Family Medicine

## 2014-11-07 VITALS — BP 150/70 | HR 53 | Temp 98.3°F | Ht 65.5 in | Wt 199.2 lb

## 2014-11-07 DIAGNOSIS — E66811 Obesity, class 1: Secondary | ICD-10-CM

## 2014-11-07 DIAGNOSIS — E7800 Pure hypercholesterolemia, unspecified: Secondary | ICD-10-CM

## 2014-11-07 DIAGNOSIS — Z23 Encounter for immunization: Secondary | ICD-10-CM | POA: Diagnosis not present

## 2014-11-07 DIAGNOSIS — E669 Obesity, unspecified: Secondary | ICD-10-CM | POA: Diagnosis not present

## 2014-11-07 DIAGNOSIS — Z0000 Encounter for general adult medical examination without abnormal findings: Secondary | ICD-10-CM | POA: Diagnosis not present

## 2014-11-07 DIAGNOSIS — Z7189 Other specified counseling: Secondary | ICD-10-CM

## 2014-11-07 DIAGNOSIS — I10 Essential (primary) hypertension: Secondary | ICD-10-CM

## 2014-11-07 MED ORDER — ATORVASTATIN CALCIUM 20 MG PO TABS: 20.0000 mg | ORAL_TABLET | Freq: Every day | ORAL | Status: DC

## 2014-11-07 MED ORDER — AMLODIPINE BESYLATE 5 MG PO TABS: 5.0000 mg | ORAL_TABLET | Freq: Every day | ORAL | Status: DC

## 2014-11-07 NOTE — Progress Notes (Signed)
Pre visit review using our clinic review tool, if applicable. No additional management support is needed unless otherwise documented below in the visit note. 

## 2014-11-07 NOTE — Patient Instructions (Signed)
Keep working on healthy eating and regular exercise! 

## 2014-11-07 NOTE — Progress Notes (Signed)
I have personally reviewed the Medicare Annual Wellness questionnaire and have noted 1. The patient's medical and social history 2. Their use of alcohol, tobacco or illicit drugs 3. Their current medications and supplements 4. The patient's functional ability including ADL's, fall risks, home safety risks and hearing or visual             impairment. 5. Diet and physical activities 6. Evidence for depression or mood disorders 7.         Updated provider list  The patients weight, height, BMI and visual acuity have been recorded in the chart  I have made referrals, counseling and provided education to the patient based review of the above and I have provided the pt with a written personalized care plan for preventive services.   HTN: Inadequated control today here on metoprolol and caduet. Elevation today due to white coat HTN. BP Readings from Last 3 Encounters:  11/07/14 150/70  10/04/13 126/64  08/24/12 130/70  Using medication without problems or lightheadedness: None Chest pain with exertion:None  Edema:None  Short of breath:None  Average home BPs: 125-130/60-70 at home  Other issues:   Elevated Cholesterol: Well controlled on on caduet at Goal LDL < 130. Lab Results  Component Value Date   CHOL 160 10/31/2014   HDL 51.20 10/31/2014   LDLCALC 82 10/31/2014   TRIG 134.0 10/31/2014   CHOLHDL 3 10/31/2014   Using medications without problems: None  Muscle aches: None  Diet compliance: Great .Marland Kitchen Still on weight watchers.  Wt Readings from Last 3 Encounters:  11/07/14 199 lb 4 oz (90.379 kg)  10/04/13 198 lb (89.812 kg)  08/24/12 189 lb (85.73 kg)  Exercise: Walking 3 times a week.  Other complaints: Body mass index is 32.64 kg/(m^2).   Review of Systems  Constitutional: Negative for fever, fatigue and unexpected weight change.  HENT: Negative for ear pain, congestion, sore throat, sneezing, trouble swallowing and sinus pressure.  Eyes: Negative for pain  and itching.  Respiratory: Negative for cough, shortness of breath and wheezing.  Cardiovascular: Negative for chest pain, palpitations and leg swelling.  Gastrointestinal: Negative for nausea, abdominal pain, diarrhea, constipation and blood in stool.  Genitourinary: Negative for dysuria, hematuria, vaginal discharge, difficulty urinating and menstrual problem.  Musculoskeletal: occ right shoulder pain.  Skin: Negative for rash.  Neurological: Negative for syncope, weakness, light-headedness, numbness and headaches.  Psychiatric/Behavioral: Negative for confusion and dysphoric mood. The patient is not nervous/anxious.  Objective:   Physical Exam  Constitutional: Vital signs are normal. She appears well-developed and well-nourished. She is cooperative. Non-toxic appearance. She does not appear ill. No distress.  HENT:  Head: Normocephalic.  Right Ear: Hearing, tympanic membrane, external ear and ear canal normal.  Left Ear: Hearing, tympanic membrane, external ear and ear canal normal.  Nose: Nose normal.  Eyes: Conjunctivae, EOM and lids are normal. Pupils are equal, round, and reactive to light. No foreign bodies found.  Neck: Trachea normal and normal range of motion. Neck supple. Carotid bruit is not present. No mass and no thyromegaly present.  Cardiovascular: Normal rate, regular rhythm, S1 normal, S2 normal, normal heart sounds and intact distal pulses. Exam reveals no gallop.  No murmur heard.  Pulmonary/Chest: Effort normal and breath sounds normal. No respiratory distress. She has no wheezes. She has no rhonchi. She has no rales.  Abdominal: Soft. Normal appearance and bowel sounds are normal. She exhibits no distension, no fluid wave, no abdominal bruit and no mass. There is no hepatosplenomegaly.  There is no tenderness. There is no rebound, no guarding and no CVA tenderness. No hernia.  Genitourinary: No breast swelling, tenderness, discharge or bleeding.  NWG:[id number]. Lymphadenopathy:  She has no cervical adenopathy.  She has no axillary adenopathy.  Neurological: She is alert. She has normal strength. No cranial nerve deficit or sensory deficit.  Skin: Skin is warm, dry and intact. No rash noted.  Psychiatric: Her speech is normal and behavior is normal. Judgment normal. Her mood appears not anxious. Cognition and memory are normal. She does not exhibit a depressed mood.  Assessment & Plan:   Annual Medicare Wellness: The patient's preventative maintenance and recommended screening tests for an annual wellness exam were reviewed in full today.  Brought up to date unless services declined.  Counselled on the importance of diet, exercise, and its role in overall health and mortality.  The patient's FH and SH was reviewed, including their home life, tobacco status, and drug and alcohol status.   Vaccines: Uptodate with flu, Td, PNA,shingles DEXA: stable, normal 2012, repeat in 2017, On HRT.  Mammogram: nml 03/2014 DVE/PAP : TAH but has one ovary... Will check DVE every 2- 3 years. No symptoms, no family histroy of ovarian cancer. Nml DVE exam 2014. Colon:05/2013 polyps, family history, repeat in 5 years, Dr. Benson Norway.  Nonsmoker.

## 2014-11-07 NOTE — Assessment & Plan Note (Signed)
Well controlled. Continue current medication.  

## 2014-11-07 NOTE — Assessment & Plan Note (Signed)
Not at goal today but well controlled at home.

## 2014-11-29 ENCOUNTER — Other Ambulatory Visit: Payer: Self-pay | Admitting: Family Medicine

## 2015-03-08 ENCOUNTER — Other Ambulatory Visit: Payer: Self-pay | Admitting: Family Medicine

## 2015-06-15 DIAGNOSIS — Z23 Encounter for immunization: Secondary | ICD-10-CM | POA: Diagnosis not present

## 2015-09-13 ENCOUNTER — Other Ambulatory Visit: Payer: Self-pay | Admitting: *Deleted

## 2015-09-13 MED ORDER — METOPROLOL SUCCINATE ER 50 MG PO TB24: ORAL_TABLET | ORAL | Status: DC

## 2015-09-13 MED ORDER — ATORVASTATIN CALCIUM 20 MG PO TABS: 20.0000 mg | ORAL_TABLET | Freq: Every day | ORAL | Status: DC

## 2015-09-13 MED ORDER — AMLODIPINE BESYLATE 5 MG PO TABS: 5.0000 mg | ORAL_TABLET | Freq: Every day | ORAL | Status: DC

## 2015-09-13 MED ORDER — ESTRADIOL 0.5 MG PO TABS: 0.5000 mg | ORAL_TABLET | ORAL | Status: DC

## 2015-09-13 NOTE — Telephone Encounter (Signed)
Pt left voicemail at Triage requesting Rxs to be sent to new pharmacy, done and pt notified

## 2015-09-18 ENCOUNTER — Telehealth: Payer: Self-pay

## 2015-09-18 NOTE — Telephone Encounter (Signed)
PA forms completed and faxed back to [facility] at [phone].

## 2015-09-18 NOTE — Telephone Encounter (Signed)
Left message at [facility] that no PA for this patient has been received.  Request they return my call with more information since they did not leave name of medication requiring PA.

## 2015-09-18 NOTE — Telephone Encounter (Signed)
Spoke with [employer]  They state it is the estradiol 0.5 that is requiring a PA.  I requested that they fax me over the PA forms so Dr. Randa Spike can complete them.  I ask they fax the forms to [phone].

## 2015-09-18 NOTE — Telephone Encounter (Signed)
Butch Penny from [facility] left v/m requesting status of prior auth for requested med that was previously sent to Dr Diona Browner. If not received PA request call [phone] using ref # [id number]. Did not leave name of med.

## 2015-09-20 NOTE — Telephone Encounter (Signed)
PA for Estradiol 0.5 mg tablets approved from 09/19/2015 until 06/15/2016.

## 2015-10-10 ENCOUNTER — Telehealth: Payer: Self-pay | Admitting: Family Medicine

## 2015-10-10 NOTE — Telephone Encounter (Signed)
LM fot pt to sch [facility] on 7/18 at 8:15 in addition to already scheduled labs. mn

## 2016-01-01 ENCOUNTER — Telehealth: Payer: Self-pay | Admitting: Family Medicine

## 2016-01-01 ENCOUNTER — Other Ambulatory Visit (INDEPENDENT_AMBULATORY_CARE_PROVIDER_SITE_OTHER): Payer: PPO

## 2016-01-01 DIAGNOSIS — E7800 Pure hypercholesterolemia, unspecified: Secondary | ICD-10-CM

## 2016-01-01 DIAGNOSIS — Z1159 Encounter for screening for other viral diseases: Secondary | ICD-10-CM

## 2016-01-01 LAB — LIPID PANEL
CHOLESTEROL: 159 mg/dL (ref 0–200)
HDL: 50.3 mg/dL (ref >39.00)
LDL Cholesterol: 80 mg/dL (ref 0–99)
NonHDL: 108.41
Total CHOL/HDL Ratio: 3
Triglycerides: 142 mg/dL (ref 0.0–149.0)
VLDL: 28.4 mg/dL (ref 0.0–40.0)

## 2016-01-01 LAB — COMPREHENSIVE METABOLIC PANEL
ALBUMIN: 4 g/dL (ref 3.5–5.2)
ALK PHOS: 70 U/L (ref 39–117)
ALT: 18 U/L (ref 0–35)
AST: 19 U/L (ref 0–37)
BUN: 14 mg/dL (ref 6–23)
CO2: 27 mEq/L (ref 19–32)
Calcium: 9.6 mg/dL (ref 8.4–10.5)
Chloride: 107 mEq/L (ref 96–112)
Creatinine, Ser: 0.8 mg/dL (ref 0.40–1.20)
GFR: 75.29 mL/min (ref >60.00)
Glucose, Bld: 95 mg/dL (ref 70–99)
POTASSIUM: 4 meq/L (ref 3.5–5.1)
Sodium: 141 mEq/L (ref 135–145)
Total Bilirubin: 0.6 mg/dL (ref 0.2–1.2)
Total Protein: 7 g/dL (ref 6.0–8.3)

## 2016-01-01 NOTE — Telephone Encounter (Signed)
-----   Message from Ellamae Sia sent at 12/27/2015 10:06 AM EDT ----- Regarding: Lab orders for Tuesday, 7.18.17 Patient is scheduled for CPX labs, please order future labs, Thanks , Karna Christmas

## 2016-01-02 LAB — HEPATITIS C ANTIBODY: HCV AB: NEGATIVE

## 2016-01-08 ENCOUNTER — Ambulatory Visit: Payer: Medicare Other | Admitting: Family Medicine

## 2016-01-09 ENCOUNTER — Ambulatory Visit (INDEPENDENT_AMBULATORY_CARE_PROVIDER_SITE_OTHER): Payer: PPO

## 2016-01-09 VITALS — BP 142/70 | HR 51 | Temp 98.5°F | Ht 65.5 in | Wt 198.2 lb

## 2016-01-09 DIAGNOSIS — Z0000 Encounter for general adult medical examination without abnormal findings: Secondary | ICD-10-CM | POA: Diagnosis not present

## 2016-01-09 NOTE — Patient Instructions (Signed)
Claire Lewis , Thank you for taking time to come for your Medicare Wellness Visit. I appreciate your ongoing commitment to your health goals. Please review the following plan we discussed and let me know if I can assist you in the future.   These are the goals we discussed: Goals    . Increase physical activity          Starting 01/09/2016, I will continue to exercise for at least 30 min 3 days a week.        This is a list of the screening recommended for you and due dates:  Health Maintenance  Topic Date Due  . Flu Shot  01/15/2016  . Mammogram  03/16/2016  . Colon Cancer Screening  05/17/2019  . Tetanus Vaccine  06/20/2019  . DEXA scan (bone density measurement)  Completed  . Shingles Vaccine  Completed  .  Hepatitis C: One time screening is recommended by Center for Disease Control  (CDC) for  adults born from [birth year] through 1965.   Completed  . Pneumonia vaccines  Completed   Preventive Care for Adults  A healthy lifestyle and preventive care can promote health and wellness. Preventive health guidelines for adults include the following key practices.  . A routine yearly physical is a good way to check with your health care provider about your health and preventive screening. It is a chance to share any concerns and updates on your health and to receive a thorough exam.  . Visit your dentist for a routine exam and preventive care every 6 months. Brush your teeth twice a day and floss once a day. Good oral hygiene prevents tooth decay and gum disease.  . The frequency of eye exams is based on your age, health, family medical history, use  of contact lenses, and other factors. Follow your health care provider's ecommendations for frequency of eye exams.  . Eat a healthy diet. Foods like vegetables, fruits, whole grains, low-fat dairy products, and lean protein foods contain the nutrients you need without too many calories. Decrease your intake of foods high in solid fats, added  sugars, and salt. Eat the right amount of calories for you. Get information about a proper diet from your health care provider, if necessary.  . Regular physical exercise is one of the most important things you can do for your health. Most adults should get at least 150 minutes of moderate-intensity exercise (any activity that increases your heart rate and causes you to sweat) each week. In addition, most adults need muscle-strengthening exercises on 2 or more days a week.  Silver Sneakers may be a benefit available to you. To determine eligibility, you may visit the website: www.silversneakers.com or contact program at 215-758-8567 Mon-Fri between 8AM-8PM.   . Maintain a healthy weight. The body mass index (BMI) is a screening tool to identify possible weight problems. It provides an estimate of body fat based on height and weight. Your health care provider can find your BMI and can help you achieve or maintain a healthy weight.   For adults 20 years and older: ? A BMI below 18.5 is considered underweight. ? A BMI of 18.5 to 24.9 is normal. ? A BMI of 25 to 29.9 is considered overweight. ? A BMI of 30 and above is considered obese.   . Maintain normal blood lipids and cholesterol levels by exercising and minimizing your intake of saturated fat. Eat a balanced diet with plenty of fruit and vegetables. Blood tests for lipids  and cholesterol should begin at age 38 and be repeated every 5 years. If your lipid or cholesterol levels are high, you are over 50, or you are at high risk for heart disease, you may need your cholesterol levels checked more frequently. Ongoing high lipid and cholesterol levels should be treated with medicines if diet and exercise are not working.  . If you smoke, find out from your health care provider how to quit. If you do not use tobacco, please do not start.  . If you choose to drink alcohol, please do not consume more than 2 drinks per day. One drink is considered to  be 12 ounces (355 mL) of beer, 5 ounces (148 mL) of wine, or 1.5 ounces (44 mL) of liquor.  . If you are 45-61 years old, ask your health care provider if you should take aspirin to prevent strokes.  . Use sunscreen. Apply sunscreen liberally and repeatedly throughout the day. You should seek shade when your shadow is shorter than you. Protect yourself by wearing long sleeves, pants, a wide-brimmed hat, and sunglasses year round, whenever you are outdoors.  . Once a month, do a whole body skin exam, using a mirror to look at the skin on your back. Tell your health care provider of new moles, moles that have irregular borders, moles that are larger than a pencil eraser, or moles that have changed in shape or color.

## 2016-01-09 NOTE — Progress Notes (Signed)
Subjective:   Claire Lewis is a 70 y.o. female who presents for Medicare Annual (Subsequent) preventive examination.  Review of Systems:  N/A Cardiac Risk Factors include: advanced age (>46men, >9 women);obesity (BMI >30kg/m2);dyslipidemia;hypertension     Objective:     Vitals: BP (!) 142/70 (BP Location: Left Arm, Patient Position: Sitting, Cuff Size: Normal)   Pulse (!) 51   Temp 98.5 F (36.9 C) (Oral)   Ht 5' 5.5" (1.664 m) Comment: no shoes  Wt 198 lb 4 oz (89.9 kg)   SpO2 96%   BMI 32.49 kg/m   Body mass index is 32.49 kg/m.   Tobacco History  Smoking Status  . Never Smoker  Smokeless Tobacco  . Never Used     Counseling given: No   Past Medical History:  Diagnosis Date  . Allergy   . GERD (gastroesophageal reflux disease)   . Hypertension    Past Surgical History:  Procedure Laterality Date  . PARTIAL HYSTERECTOMY  1983   one ovary remains for endometriosis   Family History  Problem Relation Age of Onset  . Heart attack Father 37  . Coronary artery disease Father   . Cancer Mother     breast  . Dementia Mother   . Cancer Brother     colon  . Cancer Paternal Grandmother     lung   History  Sexual Activity  . Sexual activity: No    Outpatient Encounter Prescriptions as of 01/09/2016  Medication Sig  . amLODipine (NORVASC) 5 MG tablet Take 1 tablet (5 mg total) by mouth daily.  Marland Kitchen atorvastatin (LIPITOR) 20 MG tablet Take 1 tablet (20 mg total) by mouth daily.  . Calcium Carbonate-Vitamin D (CALCIUM 600+D) 600-400 MG-UNIT per tablet Take 1 tablet by mouth daily.  . clobetasol ointment (TEMOVATE) 0.05 % APPLY AS NEEDED  . estradiol (ESTRACE) 0.5 MG tablet Take 1 tablet (0.5 mg total) by mouth every other day.  . metoprolol succinate (TOPROL-XL) 50 MG 24 hr tablet TAKE 1 TABLET DAILY WITH OR IMMEDIATELY FOLLOWING A MEAL.  Marland Kitchen Omega-3 Fatty Acids (FISH OIL) 1000 MG CPDR Take 1 tablet by mouth daily.   No facility-administered encounter  medications on file as of 01/09/2016.     Activities of Daily Living In your present state of health, do you have any difficulty performing the following activities: 01/09/2016  Hearing? N  Vision? N  Difficulty concentrating or making decisions? N  Walking or climbing stairs? N  Dressing or bathing? N  Doing errands, shopping? N  Preparing Food and eating ? N  Using the Toilet? N  In the past six months, have you accidently leaked urine? N  Do you have problems with loss of bowel control? N  Managing your Medications? N  Managing your Finances? N  Housekeeping or managing your Housekeeping? N  Some recent data might be hidden    Patient Care Team: Jinny Sanders, MD as PCP - General Conni Elliot, DDS as Referring Physician (Dentistry)    Assessment:     Hearing Screening   125Hz  250Hz  500Hz  1000Hz  2000Hz  3000Hz  4000Hz  6000Hz  8000Hz   Right ear:   0 0 40  0    Left ear:   0 0 0  0      Visual Acuity Screening   Right eye Left eye Both eyes  Without correction:     With correction: 20/25 20/30 20/25     Exercise Activities and Dietary recommendations Current Exercise Habits: Home exercise routine,  Type of exercise: walking, Time (Minutes): 30, Frequency (Times/Week): 3, Weekly Exercise (Minutes/Week): 90, Intensity: Moderate, Exercise limited by: None identified  Goals    . Increase physical activity          Starting 01/09/2016, I will continue to exercise for at least 30 min 3 days a week.       Fall Risk Fall Risk  01/09/2016 11/07/2014 10/04/2013 08/24/2012  Falls in the past year? No No No No   Depression Screen PHQ 2/9 Scores 01/09/2016 11/07/2014 10/04/2013 08/24/2012  PHQ - 2 Score 0 0 0 0     Cognitive Testing MMSE - Mini Mental State Exam 01/09/2016  Orientation to time 5  Orientation to Place 5  Registration 3  Attention/ Calculation 0  Recall 3  Language- name 2 objects 0  Language- repeat 1  Language- follow 3 step command 3  Language- read & follow  direction 0  Write a sentence 0  Copy design 0  Total score 20   PLEASE NOTE: A Mini-Cog screen was completed. Maximum score is 20. A value of 0 denotes this part of Folstein MMSE was not completed or the patient failed this part of the Mini-Cog screening.   Mini-Cog Screening Orientation to Time - Max 5 pts Orientation to Place - Max 5 pts Registration - Max 3 pts Recall - Max 3 pts Language Repeat - Max 1 pts Language Follow 3 Step Command - Max 3 pts  Immunization History  Administered Date(s) Administered  . Influenza Whole 04/16/2010  . Influenza, High Dose Seasonal PF 06/15/2015  . Pneumococcal Conjugate-13 11/07/2014  . Pneumococcal Polysaccharide-23 10/04/2013  . Td 06/19/2009  . Zoster 05/16/2008   Screening Tests Health Maintenance  Topic Date Due  . INFLUENZA VACCINE  01/15/2016  . MAMMOGRAM  03/16/2016  . COLONOSCOPY  05/17/2019  . TETANUS/TDAP  06/20/2019  . DEXA SCAN  Completed  . ZOSTAVAX  Completed  . Hepatitis C Screening  Completed  . PNA vac Low Risk Adult  Completed      Plan:     I have personally reviewed and addressed the Medicare Annual Wellness questionnaire and have noted the following in the patient's chart:  A. Medical and social history B. Use of alcohol, tobacco or illicit drugs  C. Current medications and supplements D. Functional ability and status E.  Nutritional status F.  Physical activity G. Advance directives H. List of other physicians I.  Hospitalizations, surgeries, and ER visits in previous 12 months J.  [place] to include hearing, vision, cognitive, depression L. Referrals and appointments - none  In addition, I have reviewed and discussed with patient certain preventive protocols, quality metrics, and best practice recommendations. A written personalized care plan for preventive services as well as general preventive health recommendations were provided to patient.  See attached scanned questionnaire for  additional information.   Signed,   Lindell Noe, MHA, BS, LPN Health Advisor

## 2016-01-09 NOTE — Progress Notes (Signed)
I reviewed health advisor's note, was available for consultation, and agree with documentation and plan.   Signed,  Virgen Belland T. Nassim Cosma, MD  

## 2016-01-09 NOTE — Progress Notes (Signed)
Pre visit review using our clinic review tool, if applicable. No additional management support is needed unless otherwise documented below in the visit note. 

## 2016-01-09 NOTE — Progress Notes (Signed)
PCP notes:   Health maintenance: No gaps identified or addressed   Abnormal screenings:   Hearing - failed   Patient concerns: None   Nurse concerns: None    Next PCP appt: 01/16/16 @ 0900

## 2016-01-16 ENCOUNTER — Encounter: Payer: Self-pay | Admitting: Family Medicine

## 2016-01-16 ENCOUNTER — Ambulatory Visit (INDEPENDENT_AMBULATORY_CARE_PROVIDER_SITE_OTHER): Payer: PPO | Admitting: Family Medicine

## 2016-01-16 VITALS — BP 130/62 | HR 54 | Temp 98.4°F | Ht 65.5 in | Wt 197.5 lb

## 2016-01-16 DIAGNOSIS — I10 Essential (primary) hypertension: Secondary | ICD-10-CM | POA: Diagnosis not present

## 2016-01-16 DIAGNOSIS — E66811 Obesity, class 1: Secondary | ICD-10-CM

## 2016-01-16 DIAGNOSIS — E7800 Pure hypercholesterolemia, unspecified: Secondary | ICD-10-CM | POA: Diagnosis not present

## 2016-01-16 DIAGNOSIS — E2839 Other primary ovarian failure: Secondary | ICD-10-CM

## 2016-01-16 DIAGNOSIS — Z1231 Encounter for screening mammogram for malignant neoplasm of breast: Secondary | ICD-10-CM | POA: Diagnosis not present

## 2016-01-16 DIAGNOSIS — E669 Obesity, unspecified: Secondary | ICD-10-CM

## 2016-01-16 NOTE — Progress Notes (Addendum)
Subjective:    Patient ID: Claire Lewis, female    DOB: 1946/04/20, 70 y.o.   MRN: [id number]  HPI   70 year old female presents  For AMW Part 2.  Earlier she saw Candis Musa, LPN for medicare wellness. Note will be reviewed in detail.  No concerns noted.  Pt doing well no complaints.  HTN: At goal  today here on metoprolol and amlodipine.  Hx white coat HTN. BP Readings from Last 3 Encounters:  01/16/16 130/62  01/09/16 (!) 142/70  11/07/14 (!) 150/70  Using medication without problems or lightheadedness: None Chest pain with exertion:None  Edema:None  Short of breath:None  Average home BPs: 1130/70 at home  Other issues:   Elevated Cholesterol: Well controlled on lipitor 20 mg at Goal LDL < 130. Using medications without problems: None Muscle aches: None Diet compliance: Great Exercise: walking 3 times a week Other complaints:   Social History /Family History/Past Medical History reviewed and updated if needed.    Review of Systems  Constitutional: Negative for fatigue and fever.  HENT: Negative for ear pain.   Eyes: Negative for pain.  Respiratory: Negative for chest tightness and shortness of breath.   Cardiovascular: Negative for chest pain, palpitations and leg swelling.  Gastrointestinal: Negative for abdominal pain.  Genitourinary: Negative for dysuria.  Psychiatric/Behavioral: Negative for behavioral problems.       Objective:   Physical Exam  Constitutional: Vital signs are normal. She appears well-developed and well-nourished. She is cooperative.  Non-toxic appearance. She does not appear ill. No distress.  HENT:  Head: Normocephalic.  Right Ear: Hearing, tympanic membrane, external ear and ear canal normal.  Left Ear: Hearing, tympanic membrane, external ear and ear canal normal.  Nose: Nose normal.  Eyes: Conjunctivae, EOM and lids are normal. Pupils are equal, round, and reactive to light. Lids are everted and swept, no foreign  bodies found.  Neck: Trachea normal and normal range of motion. Neck supple. Carotid bruit is not present. No thyroid mass and no thyromegaly present.  Cardiovascular: Normal rate, regular rhythm, S1 normal, S2 normal, normal heart sounds and intact distal pulses.  Exam reveals no gallop.   No murmur heard. Pulmonary/Chest: Effort normal and breath sounds normal. No respiratory distress. She has no wheezes. She has no rhonchi. She has no rales.  Abdominal: Soft. Normal appearance and bowel sounds are normal. She exhibits no distension, no fluid wave, no abdominal bruit and no mass. There is no hepatosplenomegaly. There is no tenderness. There is no rebound, no guarding and no CVA tenderness. No hernia.  Genitourinary: Vagina normal. No breast swelling, tenderness, discharge or bleeding. Pelvic exam was performed with patient supine. There is no rash, tenderness or lesion on the right labia. There is no rash, tenderness or lesion on the left labia. Right adnexum displays no mass, no tenderness and no fullness. Left adnexum displays no mass, no tenderness and no fullness.  Lymphadenopathy:    She has no cervical adenopathy.    She has no axillary adenopathy.  Neurological: She is alert. She has normal strength. No cranial nerve deficit or sensory deficit.  Skin: Skin is warm, dry and intact. No rash noted.  Psychiatric: Her speech is normal and behavior is normal. Judgment normal. Her mood appears not anxious. Cognition and memory are normal. She does not exhibit a depressed mood.          Assessment & Plan:  Annual Medicare Wellness: The patient's preventative maintenance and recommended screening tests  for an annual wellness exam were reviewed in full today.  Brought up to date unless services declined.  Counselled on the importance of diet, exercise, and its role in overall health and mortality.  The patient's FH and SH was reviewed, including their home life, tobacco status, and drug and  alcohol status.   Vaccines: Uptodate with flu, Td, PNA,shingles DEXA: stable, normal 2012, repeat in NOW, On HRT.  Mammogram: nml 03/2014, MGM, mother with breast cancer, due. DVE/PAP : TAH but has one ovary... Will check DVE every 2- 3 years. No symptoms, no family histroy of ovarian cancer.  Due for repeat DVE is year, 2017  Colon:05/2013 polyps, family history colon cancer brother, repeat in 66 years, Dr. Benson Norway.  Nonsmoker.  Hep c: done

## 2016-01-16 NOTE — Progress Notes (Signed)
Pre visit review using our clinic review tool, if applicable. No additional management support is needed unless otherwise documented below in the visit note. 

## 2016-01-16 NOTE — Assessment & Plan Note (Signed)
Well controlled. Continue current medication. Encouraged exercise, weight loss, healthy eating habits.  

## 2016-01-16 NOTE — Assessment & Plan Note (Signed)
Work on weight loss and healthy eating.

## 2016-01-16 NOTE — Assessment & Plan Note (Signed)
Well controlled. Continue current medication.  

## 2016-01-16 NOTE — Patient Instructions (Signed)
Keep working on healthy eating, regular exercise and weight loss.   

## 2016-03-20 ENCOUNTER — Ambulatory Visit
Admission: RE | Admit: 2016-03-20 | Discharge: 2016-03-20 | Disposition: A | Discharge status: Home / Self Care | Payer: PPO | Source: Ambulatory Visit | Attending: Family Medicine | Admitting: Family Medicine

## 2016-03-20 DIAGNOSIS — Z1231 Encounter for screening mammogram for malignant neoplasm of breast: Secondary | ICD-10-CM | POA: Diagnosis not present

## 2016-03-20 DIAGNOSIS — Z780 Asymptomatic menopausal state: Secondary | ICD-10-CM | POA: Diagnosis not present

## 2016-03-20 DIAGNOSIS — E2839 Other primary ovarian failure: Secondary | ICD-10-CM

## 2016-03-20 DIAGNOSIS — Z13820 Encounter for screening for osteoporosis: Secondary | ICD-10-CM | POA: Diagnosis not present

## 2016-03-21 ENCOUNTER — Other Ambulatory Visit: Payer: Self-pay | Admitting: Family Medicine

## 2016-06-20 ENCOUNTER — Other Ambulatory Visit: Payer: Self-pay | Admitting: Family Medicine

## 2016-09-22 ENCOUNTER — Other Ambulatory Visit: Payer: Self-pay | Admitting: Family Medicine

## 2016-10-09 ENCOUNTER — Ambulatory Visit: Payer: PPO | Admitting: Primary Care

## 2016-10-09 ENCOUNTER — Ambulatory Visit (INDEPENDENT_AMBULATORY_CARE_PROVIDER_SITE_OTHER): Payer: PPO | Admitting: Internal Medicine

## 2016-10-09 ENCOUNTER — Encounter: Payer: Self-pay | Admitting: Internal Medicine

## 2016-10-09 VITALS — BP 136/80 | HR 60 | Temp 98.0°F | Wt 193.0 lb

## 2016-10-09 DIAGNOSIS — R3915 Urgency of urination: Secondary | ICD-10-CM | POA: Diagnosis not present

## 2016-10-09 DIAGNOSIS — R350 Frequency of micturition: Secondary | ICD-10-CM | POA: Diagnosis not present

## 2016-10-09 LAB — POC URINALSYSI DIPSTICK (AUTOMATED)
Bilirubin, UA: NEGATIVE
GLUCOSE UA: NEGATIVE
Ketones, UA: NEGATIVE
NITRITE UA: NEGATIVE
Spec Grav, UA: 1.02 (ref 1.010–1.025)
UROBILINOGEN UA: 0.2 U/dL
pH, UA: 6 (ref 5.0–8.0)

## 2016-10-09 MED ORDER — NITROFURANTOIN MONOHYD MACRO 100 MG PO CAPS: 100.0000 mg | ORAL_CAPSULE | Freq: Two times a day (BID) | ORAL | 0 refills | Status: DC

## 2016-10-09 NOTE — Patient Instructions (Signed)

## 2016-10-09 NOTE — Progress Notes (Signed)
Pre visit review using our clinic review tool, if applicable. No additional management support is needed unless otherwise documented below in the visit note. 

## 2016-10-09 NOTE — Addendum Note (Signed)
Addended by: Lurlean Nanny on: 10/09/2016 03:54 PM   Modules accepted: Orders

## 2016-10-09 NOTE — Progress Notes (Signed)
HPI  Pt presents to the clinic today with c/o urinary urgency and frequency. This started 2-3 days ago. She denies dysuria or blood in her urine. She denies fever, chills, nausea or low back pain. She hs tried Ibuprofen without any relief.    Review of Systems  Past Medical History:  Diagnosis Date  . Allergy   . GERD (gastroesophageal reflux disease)   . Hypertension     Family History  Problem Relation Age of Onset  . Heart attack Father 83  . Coronary artery disease Father   . Cancer Mother     breast  . Dementia Mother   . Cancer Brother     colon  . Cancer Paternal Grandmother     lung    Social History   Social History  . Marital status: Married    Spouse name: N/A  . Number of children: N/A  . Years of education: N/A   Occupational History  . retired from [employer]   Social History Main Topics  . Smoking status: Never Smoker  . Smokeless tobacco: Never Used  . Alcohol use No  . Drug use: No  . Sexual activity: No   Other Topics Concern  . Not on file   Social History Narrative   Regular exercise---walks 3-4 times a week   Diet: fruits and veggies, no fried foods, on weight watchers                      No Known Allergies   Constitutional: Denies fever, malaise, fatigue, headache or abrupt weight changes.   GU: Pt reports urgency, frequency. Denies dysuria, burning sensation, blood in urine, odor or discharge. Skin: Denies redness, rashes, lesions or ulcercations.   No other specific complaints in a complete review of systems (except as listed in HPI above).    Objective:   Physical Exam  Pulse 60   Temp 98 F (36.7 C) (Oral)   Wt 193 lb (87.5 kg)   SpO2 98%   BMI 31.63 kg/m    Wt Readings from Last 3 Encounters:  10/09/16 193 lb (87.5 kg)  01/16/16 197 lb 8 oz (89.6 kg)  01/09/16 198 lb 4 oz (89.9 kg)    General: Appears her stated age, in NAD. Cardiovascular: Normal rate and rhythm. S1,S2 noted.    Abdomen:  Soft. Normal bowel sounds. No distention or masses noted.  Tender to palpation over the bladder area. No CVA tenderness.       Assessment & Plan:   Urgency, Frequency secondary to UTI:  Urinalysis: 3+ leuks, 3+ blood Will send urine culture eRx sent if for Macrobid 100 mg BID x 5 days OK to take AZO OTC Drink plenty of fluids  RTC as needed or if symptoms persist. Webb Silversmith, NP

## 2016-10-12 LAB — URINE CULTURE

## 2016-12-03 ENCOUNTER — Other Ambulatory Visit: Payer: Self-pay | Admitting: Family Medicine

## 2017-01-08 NOTE — Progress Notes (Signed)
Subjective:   Claire Lewis is a 71 y.o. female who presents for Medicare Annual (Subsequent) preventive examination.  Review of Systems:  No ROS.  Medicare Wellness Visit. Additional risk factors are reflected in the social history.  Cardiac Risk Factors include: advanced age (>17mn, >>84women);dyslipidemia;hypertension;obesity (BMI >30kg/m2)     Objective:     Vitals: BP 122/64 (BP Location: Left Arm, Patient Position: Sitting, Cuff Size: Large)   Pulse (!) 56   Resp 16   Ht _0  (1.676 m)   Wt 191 lb 12.8 oz (87 kg)   SpO2 96%   BMI 30.96 kg/m   Body mass index is 30.96 kg/m.   Tobacco History  Smoking Status  . Never Smoker  Smokeless Tobacco  . Never Used     Counseling given: Not Answered   Past Medical History:  Diagnosis Date  . Allergy   . GERD (gastroesophageal reflux disease)   . Hypertension    Past Surgical History:  Procedure Laterality Date  . PARTIAL HYSTERECTOMY  1983   one ovary remains for endometriosis   Family History  Problem Relation Age of Onset  . Heart attack Father 639 . Coronary artery disease Father   . Cancer Mother        breast  . Dementia Mother   . Cancer Brother        colon  . Cancer Paternal Grandmother        lung  . Meniere's disease Daughter    History  Sexual Activity  . Sexual activity: No    Outpatient Encounter Prescriptions as of 01/14/2017  Medication Sig  . amLODipine (NORVASC) 5 MG tablet Take 1 tablet (5 mg total) by mouth daily.  .Marland Kitchenatorvastatin (LIPITOR) 20 MG tablet Take 1 tablet (20 mg total) by mouth daily.  . Calcium Carbonate-Vitamin D (CALCIUM 600+D) 600-400 MG-UNIT per tablet Take 1 tablet by mouth daily.  . clobetasol ointment (TEMOVATE) 0.05 % APPLY AS NEEDED  . estradiol (ESTRACE) 0.5 MG tablet Take 1 tablet (0.5 mg total) by mouth every other day. (Patient taking differently: Take 0.5 mg by mouth every other day. )  . metoprolol succinate (TOPROL-XL) 50 MG 24 hr tablet Take 1  tablet by mouth daily with or immediately following a meal  . Omega-3 Fatty Acids (FISH OIL) 1000 MG CPDR Take 1 tablet by mouth daily.  . [DISCONTINUED] nitrofurantoin, macrocrystal-monohydrate, (MACROBID) 100 MG capsule Take 1 capsule (100 mg total) by mouth 2 (two) times daily.   No facility-administered encounter medications on file as of 01/14/2017.     Activities of Daily Living In your present state of health, do you have any difficulty performing the following activities: 01/14/2017  Hearing? N  Vision? N  Difficulty concentrating or making decisions? N  Walking or climbing stairs? N  Dressing or bathing? N  Doing errands, shopping? N  Preparing Food and eating ? N  Using the Toilet? N  In the past six months, have you accidently leaked urine? N  Do you have problems with loss of bowel control? N  Managing your Medications? N  Managing your Finances? N  Housekeeping or managing your Housekeeping? N  Some recent data might be hidden    Patient Care Team: BJinny Sanders MD as PCP - General DLevonne Lappingas Consulting Physician (Dentistry)    Assessment:    Physical assessment deferred to PCP.  Exercise Activities and Dietary recommendations Current Exercise Habits: Home exercise routine, Type of exercise:  walking, Time (Minutes): 25, Frequency (Times/Week): 3, Weekly Exercise (Minutes/Week): 75, Intensity: Mild  Goals    . Increase physical activity          Starting 01/09/2016, I will continue to exercise for at least 30 min 3 days a week.  01/14/2017, I have met my goal and I will maintain this goal.      Fall Risk Fall Risk  01/14/2017 01/09/2016 11/07/2014 10/04/2013 08/24/2012  Falls in the past year? _0    Depression Screen PHQ 2/9 Scores 01/14/2017 01/09/2016 11/07/2014 10/04/2013  PHQ - 2 Score 0 0 0 0     Cognitive Function PLEASE NOTE: A Mini-Cog screen was completed. Maximum score is 20. A value of 0 denotes this part of Folstein MMSE was not completed  or the patient failed this part of the Mini-Cog screening.   Mini-Cog Screening Orientation to Time - Max 5 pts Orientation to Place - Max 5 pts Registration - Max 3 pts Recall - Max 3 pts Language Repeat - Max 1 pts Language Follow 3 Step Command - Max 3 pts      Mini-Cog - 01/14/17 0816    Normal clock drawing test? yes   How many words correct? 3      MMSE - Mini Mental State Exam 01/14/2017 01/09/2016  Orientation to time 5 5  Orientation to Place 5 5  Registration 3 3  Attention/ Calculation 0 0  Recall 3 3  Language- name 2 objects 0 0  Language- repeat 1 1  Language- follow 3 step command 3 3  Language- read & follow direction 0 0  Write a sentence 0 0  Copy design 0 0  Total score 20 20        Immunization History  Administered Date(s) Administered  . Influenza Whole 04/16/2010  . Influenza, High Dose Seasonal PF 06/15/2015  . Pneumococcal Conjugate-13 11/07/2014  . Pneumococcal Polysaccharide-23 10/04/2013  . Td 06/19/2009  . Zoster 05/16/2008   Screening Tests Health Maintenance  Topic Date Due  . INFLUENZA VACCINE  01/14/2017  . MAMMOGRAM  03/20/2018  . COLONOSCOPY  05/17/2019  . TETANUS/TDAP  06/20/2019  . DEXA SCAN  Completed  . Hepatitis C Screening  Completed  . PNA vac Low Risk Adult  Completed      Plan:   Follow up with PCP as directed.   I have personally reviewed and noted the following in the patient's chart:   . Medical and social history . Use of alcohol, tobacco or illicit drugs  . Current medications and supplements . Functional ability and status . Nutritional status . Physical activity . Advanced directives . List of other physicians . Vitals . Screenings to include cognitive, depression, and falls . Referrals and appointments  In addition, I have reviewed and discussed with patient certain preventive protocols, quality metrics, and best practice recommendations. A written personalized care plan for preventive services  as well as general preventive health recommendations were provided to patient.     Ree Edman, RN  01/14/2017

## 2017-01-08 NOTE — Progress Notes (Signed)
PCP notes:   Health maintenance: Pt states that she is due in the spring of 2019. Dr Benson Norway.    Abnormal screenings: Hearing.   Patient concerns:  Occasional night terrors.    Nurse concerns: None.   Next PCP appt: 01/20/2017.

## 2017-01-13 ENCOUNTER — Telehealth: Payer: Self-pay | Admitting: Family Medicine

## 2017-01-13 DIAGNOSIS — E7800 Pure hypercholesterolemia, unspecified: Secondary | ICD-10-CM

## 2017-01-13 NOTE — Telephone Encounter (Signed)
-----   Message from Ellamae Sia sent at 01/05/2017 12:23 PM EDT ----- Regarding: Lab orders for Wednesday, 8.1.18  AWV lab orders, please.

## 2017-01-14 ENCOUNTER — Other Ambulatory Visit (INDEPENDENT_AMBULATORY_CARE_PROVIDER_SITE_OTHER): Payer: PPO

## 2017-01-14 ENCOUNTER — Ambulatory Visit (INDEPENDENT_AMBULATORY_CARE_PROVIDER_SITE_OTHER): Payer: PPO

## 2017-01-14 VITALS — BP 122/64 | HR 56 | Resp 16 | Ht 66.0 in | Wt 191.8 lb

## 2017-01-14 DIAGNOSIS — E7800 Pure hypercholesterolemia, unspecified: Secondary | ICD-10-CM

## 2017-01-14 DIAGNOSIS — Z0000 Encounter for general adult medical examination without abnormal findings: Secondary | ICD-10-CM

## 2017-01-14 LAB — COMPREHENSIVE METABOLIC PANEL
ALBUMIN: 3.8 g/dL (ref 3.5–5.2)
ALT: 17 U/L (ref 0–35)
AST: 19 U/L (ref 0–37)
Alkaline Phosphatase: 67 U/L (ref 39–117)
BILIRUBIN TOTAL: 0.6 mg/dL (ref 0.2–1.2)
BUN: 18 mg/dL (ref 6–23)
CALCIUM: 9.4 mg/dL (ref 8.4–10.5)
CHLORIDE: 107 meq/L (ref 96–112)
CO2: 29 meq/L (ref 19–32)
Creatinine, Ser: 0.89 mg/dL (ref 0.40–1.20)
GFR: 66.38 mL/min (ref >60.00)
Glucose, Bld: 97 mg/dL (ref 70–99)
Potassium: 4.4 mEq/L (ref 3.5–5.1)
Sodium: 140 mEq/L (ref 135–145)
Total Protein: 6.7 g/dL (ref 6.0–8.3)

## 2017-01-14 LAB — LIPID PANEL
Cholesterol: 154 mg/dL (ref 0–200)
HDL: 43.6 mg/dL (ref >39.00)
LDL Cholesterol: 78 mg/dL (ref 0–99)
NonHDL: 109.9
TRIGLYCERIDES: 158 mg/dL — AB (ref 0.0–149.0)
Total CHOL/HDL Ratio: 4
VLDL: 31.6 mg/dL (ref 0.0–40.0)

## 2017-01-14 NOTE — Patient Instructions (Addendum)
Claire Lewis ,  Bring a copy of your advance directives to your next office visit.  Thank you for taking time to come for your Medicare Wellness Visit. I appreciate your ongoing commitment to your health goals. Please review the following plan we discussed and let me know if I can assist you in the future.   These are the goals we discussed: Goals    . Increase physical activity          Starting 01/09/2016, I will continue to exercise for at least 30 min 3 days a week.        This is a list of the screening recommended for you and due dates:  Health Maintenance  Topic Date Due  . Flu Shot  01/14/2017  . Mammogram  03/20/2018  . Colon Cancer Screening  05/17/2019  . Tetanus Vaccine  06/20/2019  . DEXA scan (bone density measurement)  Completed  .  Hepatitis C: One time screening is recommended by Center for Disease Control  (CDC) for  adults born from [birth year] through 1965.   Completed  . Pneumonia vaccines  Completed   Preventive Care for Adults  A healthy lifestyle and preventive care can promote health and wellness. Preventive health guidelines for adults include the following key practices.  . A routine yearly physical is a good way to check with your health care provider about your health and preventive screening. It is a chance to share any concerns and updates on your health and to receive a thorough exam.  . Visit your dentist for a routine exam and preventive care every 6 months. Brush your teeth twice a day and floss once a day. Good oral hygiene prevents tooth decay and gum disease.  . The frequency of eye exams is based on your age, health, family medical history, use  of contact lenses, and other factors. Follow your health care provider's ecommendations for frequency of eye exams.  . Eat a healthy diet. Foods like vegetables, fruits, whole grains, low-fat dairy products, and lean protein foods contain the nutrients you need without too many calories. Decrease your  intake of foods high in solid fats, added sugars, and salt. Eat the right amount of calories for you. Get information about a proper diet from your health care provider, if necessary.  . Regular physical exercise is one of the most important things you can do for your health. Most adults should get at least 150 minutes of moderate-intensity exercise (any activity that increases your heart rate and causes you to sweat) each week. In addition, most adults need muscle-strengthening exercises on 2 or more days a week.  Silver Sneakers may be a benefit available to you. To determine eligibility, you may visit the website: www.silversneakers.com or contact program at [phone] [phone] Mon-Fri between 8AM-8PM.   . Maintain a healthy weight. The body mass index (BMI) is a screening tool to identify possible weight problems. It provides an estimate of body fat based on height and weight. Your health care provider can find your BMI and can help you achieve or maintain a healthy weight.   For adults 20 years and older: ? A BMI below 18.5 is considered underweight. ? A BMI of 18.5 to 24.9 is normal. ? A BMI of 25 to 29.9 is considered overweight. ? A BMI of 30 and above is considered obese.   . Maintain normal blood lipids and cholesterol levels by exercising and minimizing your intake of saturated fat. Eat a balanced diet with plenty  of fruit and vegetables. Blood tests for lipids and cholesterol should begin at age 51 and be repeated every 5 years. If your lipid or cholesterol levels are high, you are over 50, or you are at high risk for heart disease, you may need your cholesterol levels checked more frequently. Ongoing high lipid and cholesterol levels should be treated with medicines if diet and exercise are not working.  . If you smoke, find out from your health care provider how to quit. If you do not use tobacco, please do not start.  . If you choose to drink alcohol, please do not consume more than 2  drinks per day. One drink is considered to be 12 ounces (355 mL) of beer, 5 ounces (148 mL) of wine, or 1.5 ounces (44 mL) of liquor.  . If you are 69-54 years old, ask your health care provider if you should take aspirin to prevent strokes.  . Use sunscreen. Apply sunscreen liberally and repeatedly throughout the day. You should seek shade when your shadow is shorter than you. Protect yourself by wearing long sleeves, pants, a wide-brimmed hat, and sunglasses year round, whenever you are outdoors.  . Once a month, do a whole body skin exam, using a mirror to look at the skin on your back. Tell your health care provider of new moles, moles that have irregular borders, moles that are larger than a pencil eraser, or moles that have changed in shape or color.

## 2017-01-14 NOTE — Progress Notes (Signed)
I reviewed health advisor's note, was available for consultation, and agree with documentation and plan.  

## 2017-01-20 ENCOUNTER — Encounter: Payer: Self-pay | Admitting: Family Medicine

## 2017-01-20 ENCOUNTER — Ambulatory Visit (INDEPENDENT_AMBULATORY_CARE_PROVIDER_SITE_OTHER): Payer: PPO | Admitting: Family Medicine

## 2017-01-20 VITALS — BP 122/60 | HR 60 | Temp 99.2°F | Ht 66.0 in | Wt 192.8 lb

## 2017-01-20 DIAGNOSIS — Z0000 Encounter for general adult medical examination without abnormal findings: Secondary | ICD-10-CM

## 2017-01-20 DIAGNOSIS — I10 Essential (primary) hypertension: Secondary | ICD-10-CM

## 2017-01-20 DIAGNOSIS — E7800 Pure hypercholesterolemia, unspecified: Secondary | ICD-10-CM | POA: Diagnosis not present

## 2017-01-20 NOTE — Progress Notes (Signed)
Subjective:    Patient ID: Claire Lewis, female    DOB: 31-Aug-1945, 71 y.o.   MRN: [id number]  HPI  The patient presents for complete physical and review of chronic health problems.   The patient saw Candis Musa, LPN for medicare wellness. Note reviewed in detail and important notes copied below. Abnormal screenings: Hearing. Patient concerns:  Occasional night terrors. Happens earlier in AM. Off and on , not very bad most of the time.  Hypertension:    Good control on metoprolol and amlodipine BP Readings from Last 3 Encounters:  01/20/17 122/60  01/14/17 122/64  10/09/16 136/80  Using medication without problems or lightheadedness:  none Chest pain with exertion:none Edema: none Short of breath: none Average home BPs: not checking Other issues:  Elevated Cholesterol:  At goal on  Atorvastatin 20 mg daily. Lab Results  Component Value Date   CHOL 154 01/14/2017   HDL 43.60 01/14/2017   LDLCALC 78 01/14/2017   TRIG 158.0 (H) 01/14/2017   CHOLHDL 4 01/14/2017  Using medications without problems: none Muscle aches:  none Diet compliance: low cholesterol diet Exercise:  walking Body mass index is 31.11 kg/m. Wt Readings from Last 3 Encounters:  01/20/17 192 lb 12 oz (87.4 kg)  01/14/17 191 lb 12.8 oz (87 kg)  10/09/16 193 lb (87.5 kg)  Other complaints:  Blood pressure 122/60, pulse 60, temperature 99.2 F (37.3 C), temperature source Oral, height 5\' 6"  (1.676 m), weight 192 lb 12 oz (87.4 kg).  Social History /Family History/Past Medical History reviewed in detail and updated in EMR if needed.   Review of Systems  Constitutional: Negative for fatigue and fever.  HENT: Negative for congestion.   Eyes: Negative for pain.  Respiratory: Negative for cough and shortness of breath.   Cardiovascular: Negative for chest pain, palpitations and leg swelling.  Gastrointestinal: Negative for abdominal pain.  Genitourinary: Negative for dysuria and vaginal  bleeding.  Musculoskeletal: Negative for back pain.  Neurological: Negative for syncope, light-headedness and headaches.  Psychiatric/Behavioral: Negative for dysphoric mood.       Objective:   Physical Exam  Constitutional: Vital signs are normal. She appears well-developed and well-nourished. She is cooperative.  Non-toxic appearance. She does not appear ill. No distress.  HENT:  Head: Normocephalic.  Right Ear: Hearing, tympanic membrane, external ear and ear canal normal.  Left Ear: Hearing, tympanic membrane, external ear and ear canal normal.  Nose: Nose normal.  Eyes: Pupils are equal, round, and reactive to light. Conjunctivae, EOM and lids are normal. Lids are everted and swept, no foreign bodies found.  Neck: Trachea normal and normal range of motion. Neck supple. Carotid bruit is not present. No thyroid mass and no thyromegaly present.  Cardiovascular: Normal rate, regular rhythm, S1 normal, S2 normal, normal heart sounds and intact distal pulses.  Exam reveals no gallop.   No murmur heard. Pulmonary/Chest: Effort normal and breath sounds normal. No respiratory distress. She has no wheezes. She has no rhonchi. She has no rales.  Abdominal: Soft. Normal appearance and bowel sounds are normal. She exhibits no distension, no fluid wave, no abdominal bruit and no mass. There is no hepatosplenomegaly. There is no tenderness. There is no rebound, no guarding and no CVA tenderness. No hernia.  Lymphadenopathy:    She has no cervical adenopathy.    She has no axillary adenopathy.  Neurological: She is alert. She has normal strength. No cranial nerve deficit or sensory deficit.  Skin: Skin is warm, dry  and intact. No rash noted.  Psychiatric: Her speech is normal and behavior is normal. Judgment normal. Her mood appears not anxious. Cognition and memory are normal. She does not exhibit a depressed mood.          Assessment & Plan:  The patient's preventative maintenance and  recommended screening tests for an annual wellness exam were reviewed in full today. Brought up to date unless services declined.  Counselled on the importance of diet, exercise, and its role in overall health and mortality. The patient's FH and SH was reviewed, including their home life, tobacco status, and drug and alcohol status.   Vaccines: Uptodate with Td, PNA,shingles DEXA: stable, normal 03/2016, On HRT.  Mammogram: nml 03/2016, MGM, mother with breast cancer. DVE/PAP : TAH but has one ovary... Will check DVE every 2- 3 years. No symptoms, no family histroy of ovarian cancer.   Last done in 2017. Colon:2014 polyps, family history colon cancer brother, repeat in 68 years, Dr. Benson Norway.  Nonsmoker.  Hep c: done

## 2017-01-20 NOTE — Assessment & Plan Note (Signed)
Well controlled. Continue current medication.  

## 2017-01-22 ENCOUNTER — Encounter: Payer: Self-pay | Admitting: Family Medicine

## 2017-03-18 ENCOUNTER — Other Ambulatory Visit: Payer: Self-pay | Admitting: Family Medicine

## 2017-09-17 ENCOUNTER — Other Ambulatory Visit: Payer: Self-pay | Admitting: Family Medicine

## 2017-12-11 ENCOUNTER — Other Ambulatory Visit: Payer: Self-pay | Admitting: Family Medicine

## 2018-01-26 ENCOUNTER — Ambulatory Visit (INDEPENDENT_AMBULATORY_CARE_PROVIDER_SITE_OTHER): Payer: PPO

## 2018-01-26 ENCOUNTER — Ambulatory Visit: Payer: PPO

## 2018-01-26 VITALS — BP 136/82 | HR 63 | Temp 98.7°F | Ht 65.5 in | Wt 190.8 lb

## 2018-01-26 DIAGNOSIS — Z0000 Encounter for general adult medical examination without abnormal findings: Secondary | ICD-10-CM

## 2018-01-26 DIAGNOSIS — E7800 Pure hypercholesterolemia, unspecified: Secondary | ICD-10-CM | POA: Diagnosis not present

## 2018-01-26 DIAGNOSIS — I10 Essential (primary) hypertension: Secondary | ICD-10-CM | POA: Diagnosis not present

## 2018-01-26 LAB — COMPREHENSIVE METABOLIC PANEL
ALT: 17 U/L (ref 0–35)
AST: 20 U/L (ref 0–37)
Albumin: 3.8 g/dL (ref 3.5–5.2)
Alkaline Phosphatase: 67 U/L (ref 39–117)
BILIRUBIN TOTAL: 0.6 mg/dL (ref 0.2–1.2)
BUN: 15 mg/dL (ref 6–23)
CO2: 30 meq/L (ref 19–32)
CREATININE: 0.9 mg/dL (ref 0.40–1.20)
Calcium: 9.6 mg/dL (ref 8.4–10.5)
Chloride: 106 mEq/L (ref 96–112)
GFR: 65.34 mL/min (ref >60.00)
GLUCOSE: 99 mg/dL (ref 70–99)
Potassium: 4.3 mEq/L (ref 3.5–5.1)
Sodium: 141 mEq/L (ref 135–145)
Total Protein: 6.7 g/dL (ref 6.0–8.3)

## 2018-01-26 LAB — LIPID PANEL
CHOL/HDL RATIO: 3
Cholesterol: 148 mg/dL (ref 0–200)
HDL: 44.1 mg/dL (ref >39.00)
LDL Cholesterol: 73 mg/dL (ref 0–99)
NONHDL: 103.98
Triglycerides: 156 mg/dL — ABNORMAL HIGH (ref 0.0–149.0)
VLDL: 31.2 mg/dL (ref 0.0–40.0)

## 2018-01-26 NOTE — Patient Instructions (Signed)
Claire Lewis , Thank you for taking time to come for your Medicare Wellness Visit. I appreciate your ongoing commitment to your health goals. Please review the following plan we discussed and let me know if I can assist you in the future.   These are the goals we discussed: Goals    . Increase physical activity     Starting 01/26/2018, I will continue to exercise for at least 30 min 3 days a week.         This is a list of the screening recommended for you and due dates:  Health Maintenance  Topic Date Due  . Colon Cancer Screening  06/15/2018*  . Flu Shot  09/14/2018*  . Mammogram  03/20/2018  . Tetanus Vaccine  06/20/2019  . DEXA scan (bone density measurement)  Completed  .  Hepatitis C: One time screening is recommended by Center for Disease Control  (CDC) for  adults born from [birth year] through 1965.   Completed  . Pneumonia vaccines  Completed  *Topic was postponed. The date shown is not the original due date.   Preventive Care for Adults  A healthy lifestyle and preventive care can promote health and wellness. Preventive health guidelines for adults include the following key practices.  . A routine yearly physical is a good way to check with your health care provider about your health and preventive screening. It is a chance to share any concerns and updates on your health and to receive a thorough exam.  . Visit your dentist for a routine exam and preventive care every 6 months. Brush your teeth twice a day and floss once a day. Good oral hygiene prevents tooth decay and gum disease.  . The frequency of eye exams is based on your age, health, family medical history, use  of contact lenses, and other factors. Follow your health care provider's recommendations for frequency of eye exams.  . Eat a healthy diet. Foods like vegetables, fruits, whole grains, low-fat dairy products, and lean protein foods contain the nutrients you need without too many calories. Decrease your intake of  foods high in solid fats, added sugars, and salt. Eat the right amount of calories for you. Get information about a proper diet from your health care provider, if necessary.  . Regular physical exercise is one of the most important things you can do for your health. Most adults should get at least 150 minutes of moderate-intensity exercise (any activity that increases your heart rate and causes you to sweat) each week. In addition, most adults need muscle-strengthening exercises on 2 or more days a week.  Silver Sneakers may be a benefit available to you. To determine eligibility, you may visit the website: www.silversneakers.com or contact program at (703)396-6443 Mon-Fri between 8AM-8PM.   . Maintain a healthy weight. The body mass index (BMI) is a screening tool to identify possible weight problems. It provides an estimate of body fat based on height and weight. Your health care provider can find your BMI and can help you achieve or maintain a healthy weight.   For adults 20 years and older: ? A BMI below 18.5 is considered underweight. ? A BMI of 18.5 to 24.9 is normal. ? A BMI of 25 to 29.9 is considered overweight. ? A BMI of 30 and above is considered obese.   . Maintain normal blood lipids and cholesterol levels by exercising and minimizing your intake of saturated fat. Eat a balanced diet with plenty of fruit and vegetables. Blood  tests for lipids and cholesterol should begin at age 72 and be repeated every 5 years. If your lipid or cholesterol levels are high, you are over 50, or you are at high risk for heart disease, you may need your cholesterol levels checked more frequently. Ongoing high lipid and cholesterol levels should be treated with medicines if diet and exercise are not working.  . If you smoke, find out from your health care provider how to quit. If you do not use tobacco, please do not start.  . If you choose to drink alcohol, please do not consume more than 2 drinks per  day. One drink is considered to be 12 ounces (355 mL) of beer, 5 ounces (148 mL) of wine, or 1.5 ounces (44 mL) of liquor.  . If you are 57-23 years old, ask your health care provider if you should take aspirin to prevent strokes.  . Use sunscreen. Apply sunscreen liberally and repeatedly throughout the day. You should seek shade when your shadow is shorter than you. Protect yourself by wearing long sleeves, pants, a wide-brimmed hat, and sunglasses year round, whenever you are outdoors.  . Once a month, do a whole body skin exam, using a mirror to look at the skin on your back. Tell your health care provider of new moles, moles that have irregular borders, moles that are larger than a pencil eraser, or moles that have changed in shape or color.

## 2018-01-27 NOTE — Progress Notes (Signed)
PCP notes:   Health maintenance:  Colonoscopy - addressed Flu vaccine - addressed  Abnormal screenings:   None  Patient concerns:   None  Nurse concerns:  None  Next PCP appt:   02/02/18 @ 1000

## 2018-01-27 NOTE — Progress Notes (Signed)
I reviewed health advisor's note, was available for consultation, and agree with documentation and plan.  

## 2018-01-27 NOTE — Progress Notes (Signed)
Subjective:   Claire Lewis is a 72 y.o. female who presents for Medicare Annual (Subsequent) preventive examination.  Review of Systems:  N/A Cardiac Risk Factors include: advanced age (>55men, >23 women);dyslipidemia;hypertension;obesity (BMI >30kg/m2)     Objective:     Vitals: BP 136/82 (BP Location: Right Arm, Patient Position: Sitting, Cuff Size: Normal)   Pulse 63   Temp 98.7 F (37.1 C) (Oral)   Ht 5' 5.5" (1.664 m) Comment: no shoes  Wt 190 lb 12 oz (86.5 kg)   SpO2 97%   BMI 31.26 kg/m   Body mass index is 31.26 kg/m.  Advanced Directives 01/26/2018 01/14/2017 01/09/2016  Does Patient Have a Medical Advance Directive? No No No  Would patient like information on creating a medical advance directive? No - Patient declined No - Patient declined No - patient declined information    Tobacco Social History   Tobacco Use  Smoking Status Never Smoker  Smokeless Tobacco Never Used     Counseling given: No   Clinical Intake:  Pre-visit preparation completed: Yes  Pain : No/denies pain Pain Score: 0-No pain     Nutritional Status: BMI > 30  Obese Nutritional Risks: None Diabetes: No  How often do you need to have someone help you when you read instructions, pamphlets, or other written materials from your doctor or pharmacy?: 1 - Never What is the last grade level you completed in school?: 12th grade  Interpreter Needed?: No  Comments: pt lives with spouse Information entered by :: LPinson, LPN  Past Medical History:  Diagnosis Date  . Allergy   . GERD (gastroesophageal reflux disease)   . Hypertension    Past Surgical History:  Procedure Laterality Date  . PARTIAL HYSTERECTOMY  1983   one ovary remains for endometriosis   Family History  Problem Relation Age of Onset  . Heart attack Father 78  . Coronary artery disease Father   . Cancer Mother        breast  . Dementia Mother   . Cancer Brother        colon  . Cancer Paternal Grandmother         lung  . Meniere's disease Daughter    Social History   Socioeconomic History  . Marital status: Married    Spouse name: Not on file  . Number of children: Not on file  . Years of education: Not on file  . Highest education level: Not on file  Occupational History  . Occupation: retired from [employer], operational: UNEMPLOYED  Social Needs  . Financial resource strain: Not on file  . Food insecurity:    Worry: Not on file    Inability: Not on file  . Transportation needs:    Medical: Not on file    Non-medical: Not on file  Tobacco Use  . Smoking status: Never Smoker  . Smokeless tobacco: Never Used  Substance and Sexual Activity  . Alcohol use: No  . Drug use: No  . Sexual activity: Never  Lifestyle  . Physical activity:    Days per week: Not on file    Minutes per session: Not on file  . Stress: Not on file  Relationships  . Social connections:    Talks on phone: Not on file    Gets together: Not on file    Attends religious service: Not on file    Active member of club or organization: Not on file    Attends  meetings of clubs or organizations: Not on file    Relationship status: Not on file  Other Topics Concern  . Not on file  Social History Narrative   Regular exercise---walks 3-4 times a week   Diet: fruits and veggies, no fried foods, on weight watchers                   Outpatient Encounter Medications as of 01/26/2018  Medication Sig  . amLODipine (NORVASC) 5 MG tablet Take 1 tablet (5 mg total) by mouth daily.  Marland Kitchen atorvastatin (LIPITOR) 20 MG tablet Take 1 tablet by mouth once daily  . Calcium Carbonate-Vitamin D (CALCIUM 600+D) 600-400 MG-UNIT per tablet Take 1 tablet by mouth daily.  . clobetasol ointment (TEMOVATE) 0.05 % APPLY AS NEEDED  . estradiol (ESTRACE) 0.5 MG tablet Take 1 tablet (0.5 mg total) by mouth every other day.  . metoprolol succinate (TOPROL-XL) 50 MG 24 hr tablet Take 1 tablet by mouth daily with or immediately  following a meal  . Omega-3 Fatty Acids (FISH OIL) 1000 MG CPDR Take 1 tablet by mouth daily.   No facility-administered encounter medications on file as of 01/26/2018.     Activities of Daily Living In your present state of health, do you have any difficulty performing the following activities: 01/26/2018  Hearing? N  Vision? N  Difficulty concentrating or making decisions? N  Walking or climbing stairs? N  Dressing or bathing? N  Doing errands, shopping? N  Preparing Food and eating ? N  Using the Toilet? N  In the past six months, have you accidently leaked urine? N  Do you have problems with loss of bowel control? N  Managing your Medications? N  Managing your Finances? N  Housekeeping or managing your Housekeeping? N  Some recent data might be hidden    Patient Care Team: Jinny Sanders, MD as PCP - General Levonne Lapping as Consulting Physician (Dentistry)    Assessment:   This is a routine wellness examination for Zoria.   Hearing Screening   125Hz  250Hz  500Hz  1000Hz  2000Hz  3000Hz  4000Hz  6000Hz  8000Hz   Right ear:   40 40 40  40    Left ear:   40 40 40  40      Visual Acuity Screening   Right eye Left eye Both eyes  Without correction: 20/25-1 20/25-1 20/25  With correction:       Exercise Activities and Dietary recommendations Current Exercise Habits: Home exercise routine, Type of exercise: walking, Time (Minutes): 30, Frequency (Times/Week): 3, Weekly Exercise (Minutes/Week): 90, Intensity: Moderate  Goals    . Increase physical activity     Starting 01/26/2018, I will continue to exercise for at least 30 min 3 days a week.         Fall Risk Fall Risk  01/26/2018 01/14/2017 01/09/2016 11/07/2014 10/04/2013  Falls in the past year? No No No No No   Depression Screen PHQ 2/9 Scores 01/26/2018 01/14/2017 01/09/2016 11/07/2014  PHQ - 2 Score 0 0 0 0  PHQ- 9 Score 0 - - -     Cognitive Function MMSE - Mini Mental State Exam 01/26/2018 01/14/2017 01/09/2016  Orientation  to time 5 5 5   Orientation to Place 5 5 5   Registration 3 3 3   Attention/ Calculation 0 0 0  Recall 3 3 3   Language- name 2 objects 0 0 0  Language- repeat 1 1 1   Language- follow 3 step command 3 3 3   Language- read &  follow direction 0 0 0  Write a sentence 0 0 0  Copy design 0 0 0  Total score 20 20 20      PLEASE NOTE: A Mini-Cog screen was completed. Maximum score is 20. A value of 0 denotes this part of Folstein MMSE was not completed or the patient failed this part of the Mini-Cog screening.   Mini-Cog Screening Orientation to Time - Max 5 pts Orientation to Place - Max 5 pts Registration - Max 3 pts Recall - Max 3 pts Language Repeat - Max 1 pts Language Follow 3 Step Command - Max 3 pts     Immunization History  Administered Date(s) Administered  . Influenza Whole 04/16/2010  . Influenza, High Dose Seasonal PF 06/15/2015  . Pneumococcal Conjugate-13 11/07/2014  . Pneumococcal Polysaccharide-23 10/04/2013  . Td 06/19/2009  . Zoster 05/16/2008    Screening Tests Health Maintenance  Topic Date Due  . COLONOSCOPY  06/15/2018 (Originally 10/28/2017)  . INFLUENZA VACCINE  09/14/2018 (Originally 01/14/2018)  . MAMMOGRAM  03/20/2018  . TETANUS/TDAP  06/20/2019  . DEXA SCAN  Completed  . Hepatitis C Screening  Completed  . PNA vac Low Risk Adult  Completed       Plan:   I have personally reviewed, addressed, and noted the following in the patient's chart:  A. Medical and social history B. Use of alcohol, tobacco or illicit drugs  C. Current medications and supplements D. Functional ability and status E.  Nutritional status F.  Physical activity G. Advance directives H. List of other physicians I.  Hospitalizations, surgeries, and ER visits in previous 12 months J.  [place] to include hearing, vision, cognitive, depression L. Referrals and appointments - none  In addition, I have reviewed and discussed with patient certain preventive protocols,  quality metrics, and best practice recommendations. A written personalized care plan for preventive services as well as general preventive health recommendations were provided to patient.  See attached scanned questionnaire for additional information.   Signed,   Lindell Noe, MHA, BS, LPN Health Coach

## 2018-02-02 ENCOUNTER — Ambulatory Visit (INDEPENDENT_AMBULATORY_CARE_PROVIDER_SITE_OTHER): Payer: PPO | Admitting: Family Medicine

## 2018-02-02 ENCOUNTER — Encounter: Payer: Self-pay | Admitting: Family Medicine

## 2018-02-02 VITALS — BP 140/60 | HR 60 | Temp 98.7°F | Ht 65.5 in | Wt 192.0 lb

## 2018-02-02 DIAGNOSIS — I10 Essential (primary) hypertension: Secondary | ICD-10-CM | POA: Diagnosis not present

## 2018-02-02 DIAGNOSIS — E7800 Pure hypercholesterolemia, unspecified: Secondary | ICD-10-CM | POA: Diagnosis not present

## 2018-02-02 DIAGNOSIS — Z0000 Encounter for general adult medical examination without abnormal findings: Secondary | ICD-10-CM

## 2018-02-02 DIAGNOSIS — R1310 Dysphagia, unspecified: Secondary | ICD-10-CM | POA: Insufficient documentation

## 2018-02-02 DIAGNOSIS — R1312 Dysphagia, oropharyngeal phase: Secondary | ICD-10-CM

## 2018-02-02 MED ORDER — CLOBETASOL PROPIONATE 0.05 % EX OINT: TOPICAL_OINTMENT | CUTANEOUS | 0 refills | Status: DC | PRN

## 2018-02-02 MED ORDER — METOCLOPRAMIDE HCL 5 MG PO TABS: 5.0000 mg | ORAL_TABLET | Freq: Three times a day (TID) | ORAL | 0 refills | Status: DC

## 2018-02-02 NOTE — Assessment & Plan Note (Signed)
Well controlled. Continue current medication. Encouraged exercise, weight loss, healthy eating habits.  

## 2018-02-02 NOTE — Assessment & Plan Note (Signed)
Pt states negative work up in 2010.Marland Kitchen No stricture  Seen. She requests med to help esophagus relax.  I recommended eval with ENDO per Dr. Benson Norway given worsening over 10 years and need to rule out esophageal change, stricture GERD, mass etc.  Will try trial of reglan until she can see GI.

## 2018-02-02 NOTE — Assessment & Plan Note (Signed)
Well controlled. Continue current medication.  

## 2018-02-02 NOTE — Patient Instructions (Addendum)
Trial of Reglan with meals Call to make appt with Dr. Benson Norway for colonoscopy and to discuss dysphagia and possible need for endoscopy.  Get back to walking 3-5 times week.  Get flu vaccine when available. Call to schedule mammogram on your own.

## 2018-02-02 NOTE — Progress Notes (Signed)
Subjective:    Patient ID: Claire Lewis, female    DOB: 1945-12-05, 72 y.o.   MRN: [id number]  HPI  The patient presents for complete physical and review of chronic health problems. He/She also has the following acute concerns today: She has noted some gradaully worsening of dysphagia and hiatal hernia.  Meats most, liquids are fine.  Occ has to cough or throw up  No heartburn... Had in past but resolved with weight loss.  ENDO 2010... Had same symptoms.. But no stricture, just hiatal hernia  The patient saw Candis Musa, LPN for medicare wellness. Note reviewed in detail and important notes copied below. Health maintenance:  Colonoscopy - addressed Flu vaccine - addressed  Abnormal screenings:   None  02/02/18 Today  Hypertension:   Borderline control in office today on amlodipine, metoprolol. BP Readings from Last 3 Encounters:  02/02/18 140/60  01/26/18 136/82  01/20/17 122/60  Using medication without problems or lightheadedness:  none Chest pain with exertion:none Edema:none Short of breath:none Average home BPs: good at other doctors Other issues:  Elevated Cholesterol: LDL at goal on lipitor daily. Lab Results  Component Value Date   CHOL 148 01/26/2018   HDL 44.10 01/26/2018   LDLCALC 73 01/26/2018   TRIG 156.0 (H) 01/26/2018   CHOLHDL 3 01/26/2018  Using medications without problems: Muscle aches:  Diet compliance: low fat Exercise: wing some but not muchalk Other complaints:   Social History /Family History/Past Medical History reviewed in detail and updated in EMR if needed. Blood pressure 140/60, pulse 60, temperature 98.7 F (37.1 C), temperature source Oral, height 5' 5.5" (1.664 m), weight 192 lb (87.1 kg).  Review of Systems  Constitutional: Negative for fatigue and fever.  HENT: Negative for congestion.   Eyes: Negative for pain.  Respiratory: Negative for cough and shortness of breath.   Cardiovascular: Negative for chest  pain, palpitations and leg swelling.  Gastrointestinal: Negative for abdominal pain.  Genitourinary: Negative for dysuria and vaginal bleeding.  Musculoskeletal: Negative for back pain.  Neurological: Negative for syncope, light-headedness and headaches.  Psychiatric/Behavioral: Negative for dysphoric mood.       Objective:   Physical Exam  Constitutional: Vital signs are normal. She appears well-developed and well-nourished. She is cooperative.  Non-toxic appearance. She does not appear ill. No distress.  HENT:  Head: Normocephalic.  Right Ear: Hearing, tympanic membrane, external ear and ear canal normal.  Left Ear: Hearing, tympanic membrane, external ear and ear canal normal.  Nose: Nose normal.  Eyes: Pupils are equal, round, and reactive to light. Conjunctivae, EOM and lids are normal. Lids are everted and swept, no foreign bodies found.  Neck: Trachea normal and normal range of motion. Neck supple. Carotid bruit is not present. No thyroid mass and no thyromegaly present.  Cardiovascular: Normal rate, regular rhythm, S1 normal, S2 normal, normal heart sounds and intact distal pulses. Exam reveals no gallop.  No murmur heard. Pulmonary/Chest: Effort normal and breath sounds normal. No respiratory distress. She has no wheezes. She has no rhonchi. She has no rales.  Abdominal: Soft. Normal appearance and bowel sounds are normal. She exhibits no distension, no fluid wave, no abdominal bruit and no mass. There is no hepatosplenomegaly. There is no tenderness. There is no rebound, no guarding and no CVA tenderness. No hernia.  Lymphadenopathy:    She has no cervical adenopathy.    She has no axillary adenopathy.  Neurological: She is alert. She has normal strength. No cranial nerve deficit or  sensory deficit.  Skin: Skin is warm, dry and intact. No rash noted.  Psychiatric: Her speech is normal and behavior is normal. Judgment normal. Her mood appears not anxious. Cognition and memory are  normal. She does not exhibit a depressed mood.          Assessment & Plan:  The patient's preventative maintenance and recommended screening tests for an annual wellness exam were reviewed in full today. Brought up to date unless services declined.  Counselled on the importance of diet, exercise, and its role in overall health and mortality. The patient's FH and SH was reviewed, including their home life, tobacco status, and drug and alcohol status.   Vaccines: Uptodate with Td, PNA,shingles DEXA: stable, normal 03/2016, On HRT.  repeat in 5 years Mammogram: nml 03/2016, MGM, mother with breast cancer. Due for re-eval. DVE/PAP : TAH but has one ovary... Will check DVE every  3 years. No symptoms, no family histroy of ovarian cancer.  Last done in 2017. Colon:2014 polyps, family history colon cancer brother, repeat in 57 years, Dr. Benson Norway. Plan later this year. Nonsmoker. Hep c: done

## 2018-02-03 ENCOUNTER — Telehealth: Payer: Self-pay | Admitting: Family Medicine

## 2018-02-03 NOTE — Telephone Encounter (Signed)
Copied from [place] [id number]. Topic: Quick Communication - See Telephone Encounter >> Feb 03, 2018 11:41 AM Mylinda Latina, NT wrote: CRM for notification. See Telephone encounter for: 02/03/18. Patient called and states she seen the provider and was prescribed clobetasol ointment (TEMOVATE) 0.05 %. She states it cost her over $90 and she is wondering if  she can get something else a little cheaper.  [id number] [address], [place] [phone] (Phone) [phone] (Fax)

## 2018-02-03 NOTE — Telephone Encounter (Signed)
Patient  states saw Dr. Diona Browner yesterday and she prescribed clobetasol ointment (TEMOVATE) 0.05 %. She states it cost her over $90; she is questioning if there is alternative medication less expensive.  [id number] [address], [place]       [phone] (Phone) [phone] (Fax)

## 2018-02-04 MED ORDER — HALOBETASOL PROPIONATE 0.05 % EX OINT: TOPICAL_OINTMENT | Freq: Two times a day (BID) | CUTANEOUS | 0 refills | Status: DC

## 2018-02-04 NOTE — Telephone Encounter (Signed)
Sent in  [place], but if it is not covered she may want to  Call insurance to see what they cover better.

## 2018-02-04 NOTE — Telephone Encounter (Signed)
Left message for Claire Lewis to return call in regards to clobetasol prescription.  When patient calls back, OK for [facility] Triage Nurse to ask her the below questions from Dr. Diona Browner.  CRM created.

## 2018-02-04 NOTE — Telephone Encounter (Signed)
This was just a refill for her lichen sclerosis correct? Has she tried anything else in past for this issue?

## 2018-02-04 NOTE — Telephone Encounter (Signed)
Pt states she has not tried anything else for the lichen sclerosis. She states she has used Temovate for more than 10 years but this time it was going to cost $90 for her to get it filled this time.

## 2018-02-04 NOTE — Telephone Encounter (Signed)
Message below signed in error.

## 2018-02-04 NOTE — Telephone Encounter (Signed)
Ms. Podgurski notified as instructed by telephone.

## 2018-02-04 NOTE — Addendum Note (Signed)
Addended byEliezer Lofts E on: 02/04/2018 02:21 PM   Modules accepted: Orders

## 2018-02-09 ENCOUNTER — Other Ambulatory Visit: Payer: Self-pay | Admitting: Family Medicine

## 2018-02-09 DIAGNOSIS — Z1231 Encounter for screening mammogram for malignant neoplasm of breast: Secondary | ICD-10-CM

## 2018-03-14 ENCOUNTER — Other Ambulatory Visit: Payer: Self-pay | Admitting: Family Medicine

## 2018-03-16 ENCOUNTER — Ambulatory Visit
Admission: RE | Admit: 2018-03-16 | Discharge: 2018-03-16 | Disposition: A | Discharge status: Home / Self Care | Payer: PPO | Source: Ambulatory Visit | Attending: Family Medicine | Admitting: Family Medicine

## 2018-03-16 DIAGNOSIS — Z1231 Encounter for screening mammogram for malignant neoplasm of breast: Secondary | ICD-10-CM

## 2018-03-24 DIAGNOSIS — Z800 Family history of malignant neoplasm of digestive organs: Secondary | ICD-10-CM | POA: Diagnosis not present

## 2018-03-24 DIAGNOSIS — Z86010 Personal history of colonic polyps: Secondary | ICD-10-CM | POA: Diagnosis not present

## 2018-03-24 DIAGNOSIS — R1310 Dysphagia, unspecified: Secondary | ICD-10-CM | POA: Diagnosis not present

## 2018-03-25 ENCOUNTER — Other Ambulatory Visit: Payer: Self-pay | Admitting: Family Medicine

## 2018-04-29 ENCOUNTER — Encounter: Payer: Self-pay | Admitting: Family Medicine

## 2018-04-29 DIAGNOSIS — D123 Benign neoplasm of transverse colon: Secondary | ICD-10-CM | POA: Diagnosis not present

## 2018-04-29 DIAGNOSIS — K635 Polyp of colon: Secondary | ICD-10-CM | POA: Diagnosis not present

## 2018-04-29 DIAGNOSIS — K5730 Diverticulosis of large intestine without perforation or abscess without bleeding: Secondary | ICD-10-CM | POA: Diagnosis not present

## 2018-04-29 DIAGNOSIS — R1310 Dysphagia, unspecified: Secondary | ICD-10-CM | POA: Diagnosis not present

## 2018-04-29 DIAGNOSIS — K210 Gastro-esophageal reflux disease with esophagitis: Secondary | ICD-10-CM | POA: Diagnosis not present

## 2018-04-29 DIAGNOSIS — Z86010 Personal history of colonic polyps: Secondary | ICD-10-CM | POA: Diagnosis not present

## 2018-04-29 LAB — HM COLONOSCOPY

## 2018-04-30 ENCOUNTER — Encounter: Payer: Self-pay | Admitting: Family Medicine

## 2018-06-02 DIAGNOSIS — K210 Gastro-esophageal reflux disease with esophagitis: Secondary | ICD-10-CM | POA: Diagnosis not present

## 2018-06-02 DIAGNOSIS — R1310 Dysphagia, unspecified: Secondary | ICD-10-CM | POA: Diagnosis not present

## 2018-09-08 ENCOUNTER — Other Ambulatory Visit: Payer: Self-pay | Admitting: Family Medicine

## 2019-02-01 ENCOUNTER — Other Ambulatory Visit (INDEPENDENT_AMBULATORY_CARE_PROVIDER_SITE_OTHER): Payer: PPO

## 2019-02-01 ENCOUNTER — Other Ambulatory Visit: Payer: Self-pay

## 2019-02-01 ENCOUNTER — Telehealth: Payer: Self-pay | Admitting: Family Medicine

## 2019-02-01 DIAGNOSIS — E7800 Pure hypercholesterolemia, unspecified: Secondary | ICD-10-CM | POA: Diagnosis not present

## 2019-02-01 LAB — LIPID PANEL
Cholesterol: 175 mg/dL (ref 0–200)
HDL: 44.6 mg/dL (ref >39.00)
LDL Cholesterol: 91 mg/dL (ref 0–99)
NonHDL: 130.88
Total CHOL/HDL Ratio: 4
Triglycerides: 197 mg/dL — ABNORMAL HIGH (ref 0.0–149.0)
VLDL: 39.4 mg/dL (ref 0.0–40.0)

## 2019-02-01 LAB — COMPREHENSIVE METABOLIC PANEL
ALT: 14 U/L (ref 0–35)
AST: 16 U/L (ref 0–37)
Albumin: 4 g/dL (ref 3.5–5.2)
Alkaline Phosphatase: 76 U/L (ref 39–117)
BUN: 18 mg/dL (ref 6–23)
CO2: 28 mEq/L (ref 19–32)
Calcium: 9.1 mg/dL (ref 8.4–10.5)
Chloride: 106 mEq/L (ref 96–112)
Creatinine, Ser: 0.86 mg/dL (ref 0.40–1.20)
GFR: 64.6 mL/min (ref >60.00)
Glucose, Bld: 105 mg/dL — ABNORMAL HIGH (ref 70–99)
Potassium: 4 mEq/L (ref 3.5–5.1)
Sodium: 141 mEq/L (ref 135–145)
Total Bilirubin: 0.6 mg/dL (ref 0.2–1.2)
Total Protein: 6.4 g/dL (ref 6.0–8.3)

## 2019-02-01 NOTE — Progress Notes (Signed)
No critical labs need to be addressed urgently. We will discuss labs in detail at upcoming office visit.   

## 2019-02-01 NOTE — Telephone Encounter (Signed)
-----   Message from Ellamae Sia sent at 01/25/2019 11:34 AM EDT ----- Regarding: Lab orders for Tuesday, 8.18.20 Patient is scheduled for CPX labs, please order future labs, Thanks , Karna Christmas

## 2019-02-08 ENCOUNTER — Ambulatory Visit (INDEPENDENT_AMBULATORY_CARE_PROVIDER_SITE_OTHER): Payer: PPO | Admitting: Family Medicine

## 2019-02-08 ENCOUNTER — Encounter: Payer: Self-pay | Admitting: Family Medicine

## 2019-02-08 ENCOUNTER — Other Ambulatory Visit: Payer: Self-pay

## 2019-02-08 ENCOUNTER — Ambulatory Visit: Payer: PPO

## 2019-02-08 VITALS — BP 142/70 | HR 64 | Temp 98.2°F | Ht 65.25 in | Wt 198.8 lb

## 2019-02-08 DIAGNOSIS — Z23 Encounter for immunization: Secondary | ICD-10-CM | POA: Diagnosis not present

## 2019-02-08 DIAGNOSIS — K2100 Gastro-esophageal reflux disease with esophagitis, without bleeding: Secondary | ICD-10-CM

## 2019-02-08 DIAGNOSIS — E7800 Pure hypercholesterolemia, unspecified: Secondary | ICD-10-CM | POA: Diagnosis not present

## 2019-02-08 DIAGNOSIS — R7303 Prediabetes: Secondary | ICD-10-CM | POA: Insufficient documentation

## 2019-02-08 DIAGNOSIS — I10 Essential (primary) hypertension: Secondary | ICD-10-CM | POA: Diagnosis not present

## 2019-02-08 DIAGNOSIS — Z0000 Encounter for general adult medical examination without abnormal findings: Secondary | ICD-10-CM

## 2019-02-08 NOTE — Assessment & Plan Note (Signed)
Get back on track with healthy lifestyle.

## 2019-02-08 NOTE — Progress Notes (Signed)
Chief Complaint  Patient presents with  . Medicare Wellness    History of Present Illness: HPI  The patient presents for annual medicare wellness, complete physical and review of chronic health problems. He/She also has the following acute concerns today: none  I have personally reviewed the Medicare Annual Wellness questionnaire and have noted 1. The patient's medical and social history 2. Their use of alcohol, tobacco or illicit drugs 3. Their current medications and supplements 4. The patient's functional ability including ADL's, fall risks, home safety risks and hearing or visual             impairment. 5. Diet and physical activities 6. Evidence for depression or mood disorders 7.         Updated provider list Cognitive evaluation was performed and recorded on pt medicare questionnaire form. The patients weight, height, BMI and visual acuity have been recorded in the chart  I have made referrals, counseling and provided education to the patient based review of the above and I have provided the pt with a written personalized care plan for preventive services.   Documentation of this information was scanned into the electronic record under the media tab.   Advance directives and end of life planning reviewed in detail with patient and documented in EMR. Patient given handout on advance care directives if needed. HCPOA and living will updated if needed.   Hearing Screening   Method: Audiometry   125Hz  250Hz  500Hz  1000Hz  2000Hz  3000Hz  4000Hz  6000Hz  8000Hz   Right ear:   0 0 25  20    Left ear:   20 20 20   0    Vision Screening Comments: Eye Exam 02/08/19 with Dr. [name] at [facility].  Fall Risk  02/08/2019 01/26/2018 01/14/2017 01/09/2016 11/07/2014  Falls in the past year? 0 No No No No   Depression screen [facility] 2/9 02/08/2019 01/26/2018 01/14/2017  Decreased Interest 0 0 0  Down, Depressed, Hopeless 0 0 0  PHQ - 2 Score 0 0 0  Altered sleeping - 0 -  Tired, decreased  energy - 0 -  Change in appetite - 0 -  Feeling bad or failure about yourself  - 0 -  Trouble concentrating - 0 -  Moving slowly or fidgety/restless - 0 -  Suicidal thoughts - 0 -  PHQ-9 Score - 0 -  Difficult doing work/chores - Not difficult at all -   Hypertension:  Borderline control in office today on amlodipine , metoprolol. BP Readings from Last 3 Encounters:  02/08/19 (!) 142/70  02/02/18 140/60  01/26/18 136/82  Using medication without problems or lightheadedness:  none Chest pain with exertion:none Edema:none Short of breath:none Average home BPs: 130/70 Other issues:  Elevated Cholesterol:  LDL at gaol < 100 on Lipitor daily. Lab Results  Component Value Date   CHOL 175 02/01/2019   HDL 44.60 02/01/2019   LDLCALC 91 02/01/2019   TRIG 197.0 (H) 02/01/2019   CHOLHDL 4 02/01/2019  Using medications without problems:none Muscle aches:  none Diet compliance: not as well controlled, stopped weight watchers.Marland Kitchen getting back on track Exercise: walking 30 min daily. Other complaints:   GERD:  Improved on omeprazole. COVID 19 screen No recent travel or known exposure to COVID19 The patient denies respiratory symptoms of COVID 19 at this time.  The importance of social distancing was discussed today.   Review of Systems  Constitutional: Negative for chills and fever.  HENT: Negative for congestion and ear pain.   Eyes:  Negative for pain and redness.  Respiratory: Negative for cough and shortness of breath.   Cardiovascular: Negative for chest pain, palpitations and leg swelling.  Gastrointestinal: Negative for abdominal pain, blood in stool, constipation, diarrhea, nausea and vomiting.  Genitourinary: Negative for dysuria.  Musculoskeletal: Negative for falls and myalgias.  Skin: Negative for rash.  Neurological: Negative for dizziness.  Psychiatric/Behavioral: Negative for depression. The patient is not nervous/anxious.       Past Medical History:  Diagnosis  Date  . Allergy   . GERD (gastroesophageal reflux disease)   . Hypertension     reports that she has never smoked. She has never used smokeless tobacco. She reports that she does not drink alcohol or use drugs.   Current Outpatient Medications:  .  amLODipine (NORVASC) 5 MG tablet, Take 1 tablet by mouth daily, Disp: 90 tablet, Rfl: 1 .  atorvastatin (LIPITOR) 20 MG tablet, Take 1 tablet by mouth once daily, Disp: 90 tablet, Rfl: 3 .  Calcium Carbonate-Vitamin D (CALCIUM 600+D) 600-400 MG-UNIT per tablet, Take 1 tablet by mouth daily., Disp: , Rfl:  .  clobetasol ointment (TEMOVATE) 0.05 %, Apply topically as needed., Disp: 30 g, Rfl: 0 .  estradiol (ESTRACE) 0.5 MG tablet, Take 1 tablet by mouth every other day, Disp: 45 tablet, Rfl: 3 .  halobetasol (ULTRAVATE) 0.05 % ointment, Apply topically 2 (two) times daily., Disp: 15 g, Rfl: 0 .  metoprolol succinate (TOPROL-XL) 50 MG 24 hr tablet, Take 1 tablet by mouth daily with or immediately following a meal, Disp: 90 tablet, Rfl: 1 .  Omega-3 Fatty Acids (FISH OIL) 1000 MG CPDR, Take 1 tablet by mouth daily., Disp: , Rfl:  .  omeprazole (PRILOSEC) 40 MG capsule, Take 40 mg by mouth daily., Disp: , Rfl:    Observations/Objective: Blood pressure (!) 142/70, pulse 64, temperature 98.2 F (36.8 C), temperature source Temporal, height 5' 5.25" (1.657 m), weight 198 lb 12 oz (90.2 kg), SpO2 96 %.  Physical Exam Constitutional:      General: She is not in acute distress.    Appearance: Normal appearance. She is well-developed. She is not ill-appearing or toxic-appearing.  HENT:     Head: Normocephalic.     Right Ear: Hearing, tympanic membrane, ear canal and external ear normal.     Left Ear: Hearing, tympanic membrane, ear canal and external ear normal.     Nose: Nose normal.  Eyes:     General: Lids are normal. Lids are everted, no foreign bodies appreciated.     Conjunctiva/sclera: Conjunctivae normal.     Pupils: Pupils are equal, round,  and reactive to light.  Neck:     Musculoskeletal: Normal range of motion and neck supple.     Thyroid: No thyroid mass or thyromegaly.     Vascular: No carotid bruit.     Trachea: Trachea normal.  Cardiovascular:     Rate and Rhythm: Normal rate and regular rhythm.     Heart sounds: Normal heart sounds, S1 normal and S2 normal. No murmur. No gallop.   Pulmonary:     Effort: Pulmonary effort is normal. No respiratory distress.     Breath sounds: Normal breath sounds. No wheezing, rhonchi or rales.  Abdominal:     General: Bowel sounds are normal. There is no distension or abdominal bruit.     Palpations: Abdomen is soft. There is no fluid wave or mass.     Tenderness: There is no abdominal tenderness. There is no guarding  or rebound.     Hernia: No hernia is present.  Lymphadenopathy:     Cervical: No cervical adenopathy.  Skin:    General: Skin is warm and dry.     Findings: No rash.  Neurological:     Mental Status: She is alert.     Cranial Nerves: No cranial nerve deficit.     Sensory: No sensory deficit.  Psychiatric:        Mood and Affect: Mood is not anxious or depressed.        Speech: Speech normal.        Behavior: Behavior normal. Behavior is cooperative.        Judgment: Judgment normal.      Assessment and Plan The patient's preventative maintenance and recommended screening tests for an annual wellness exam were reviewed in full today. Brought up to date unless services declined.  Counselled on the importance of diet, exercise, and its role in overall health and mortality. The patient's FH and SH was reviewed, including their home life, tobacco status, and drug and alcohol status.   Vaccines: Uptodate with Td, PNA,shingles, given flu today DEXA: stable, normal10/2017,On HRT.  repeat in 5 years Mammogram: nml 03/2018, MGM, mother with breast cancer. DVE/PAP : TAH but has one ovary...  No symptoms, no family histroy of ovarian cancer. No need for DVE pt  agreeable Colon: 2019 polyps, family history colon cancer brother, repeat in 55 years, Dr. Benson Norway.  Nonsmoker. Hep c: done   Essential hypertension, benign Well controlled. Continue current medication.   PURE HYPERCHOLESTEROLEMIA  Get back on track with healthy lifestyle.  GERD (gastroesophageal reflux disease) Improved on omeprazole daily.  Prediabetes Low carb diet.     Eliezer Lofts, MD

## 2019-02-08 NOTE — Assessment & Plan Note (Signed)
Improved on omeprazole daily.

## 2019-02-08 NOTE — Assessment & Plan Note (Signed)
Well controlled. Continue current medication.  

## 2019-02-08 NOTE — Patient Instructions (Addendum)
Work on low cholesterol  Low carb healthy diet.  Continue back with exercise.  Set up mammogram on your own.

## 2019-02-08 NOTE — Assessment & Plan Note (Signed)
Low carb diet 

## 2019-02-09 ENCOUNTER — Other Ambulatory Visit: Payer: Self-pay | Admitting: Family Medicine

## 2019-02-09 DIAGNOSIS — Z1231 Encounter for screening mammogram for malignant neoplasm of breast: Secondary | ICD-10-CM

## 2019-02-16 ENCOUNTER — Other Ambulatory Visit: Payer: Self-pay | Admitting: *Deleted

## 2019-02-16 MED ORDER — ATORVASTATIN CALCIUM 20 MG PO TABS: 20.0000 mg | ORAL_TABLET | Freq: Every day | ORAL | 3 refills | Status: DC

## 2019-02-16 MED ORDER — AMLODIPINE BESYLATE 5 MG PO TABS: 5.0000 mg | ORAL_TABLET | Freq: Every day | ORAL | 3 refills | Status: DC

## 2019-02-16 MED ORDER — METOPROLOL SUCCINATE ER 50 MG PO TB24: ORAL_TABLET | ORAL | 3 refills | Status: DC

## 2019-03-15 DIAGNOSIS — H40013 Open angle with borderline findings, low risk, bilateral: Secondary | ICD-10-CM | POA: Diagnosis not present

## 2019-03-15 DIAGNOSIS — H25043 Posterior subcapsular polar age-related cataract, bilateral: Secondary | ICD-10-CM | POA: Diagnosis not present

## 2019-03-15 DIAGNOSIS — H2512 Age-related nuclear cataract, left eye: Secondary | ICD-10-CM | POA: Diagnosis not present

## 2019-03-15 DIAGNOSIS — H25013 Cortical age-related cataract, bilateral: Secondary | ICD-10-CM | POA: Diagnosis not present

## 2019-03-15 DIAGNOSIS — H18413 Arcus senilis, bilateral: Secondary | ICD-10-CM | POA: Diagnosis not present

## 2019-03-15 DIAGNOSIS — H2513 Age-related nuclear cataract, bilateral: Secondary | ICD-10-CM | POA: Diagnosis not present

## 2019-03-29 ENCOUNTER — Other Ambulatory Visit: Payer: Self-pay | Admitting: Family Medicine

## 2019-03-29 ENCOUNTER — Other Ambulatory Visit: Payer: Self-pay

## 2019-03-29 ENCOUNTER — Ambulatory Visit
Admission: RE | Admit: 2019-03-29 | Discharge: 2019-03-29 | Disposition: A | Discharge status: Home / Self Care | Payer: PPO | Source: Ambulatory Visit | Attending: Family Medicine | Admitting: Family Medicine

## 2019-03-29 DIAGNOSIS — Z1231 Encounter for screening mammogram for malignant neoplasm of breast: Secondary | ICD-10-CM | POA: Diagnosis not present

## 2019-04-17 HISTORY — PX: CATARACT EXTRACTION, BILATERAL: SHX1313

## 2019-04-25 DIAGNOSIS — H2512 Age-related nuclear cataract, left eye: Secondary | ICD-10-CM | POA: Diagnosis not present

## 2019-04-26 DIAGNOSIS — H2511 Age-related nuclear cataract, right eye: Secondary | ICD-10-CM | POA: Diagnosis not present

## 2019-05-09 ENCOUNTER — Other Ambulatory Visit: Payer: Self-pay

## 2019-05-16 DIAGNOSIS — H2511 Age-related nuclear cataract, right eye: Secondary | ICD-10-CM | POA: Diagnosis not present

## 2019-06-20 ENCOUNTER — Other Ambulatory Visit: Payer: Self-pay

## 2019-06-20 NOTE — Telephone Encounter (Signed)
Pt left v/m that she has not gotten her mail order med from [facility] and pt has 1 pill left and request [facility] qty [facility] for metoprolol succ ER 50 mg.

## 2019-06-21 ENCOUNTER — Other Ambulatory Visit: Payer: Self-pay | Admitting: Family Medicine

## 2019-06-21 MED ORDER — METOPROLOL SUCCINATE ER 50 MG PO TB24: ORAL_TABLET | ORAL | 0 refills | Status: DC

## 2019-06-21 NOTE — Telephone Encounter (Signed)
[facility] [place]

## 2019-06-27 DIAGNOSIS — K219 Gastro-esophageal reflux disease without esophagitis: Secondary | ICD-10-CM | POA: Diagnosis not present

## 2019-08-24 ENCOUNTER — Telehealth: Payer: Self-pay | Admitting: Family Medicine

## 2019-08-24 NOTE — Chronic Care Management (AMB) (Signed)
  Chronic Care Management   Note  08/24/2019 Name: SAMRAH MOLITOR MRN: [id number] DOB: 1946/03/07  [name] is a 74 y.o. year old female who is a primary care patient of Bedsole, Amy E, MD. I reached out to [name] by phone today in response to a referral sent by Ms. Horton Finer Daughtridge's PCP, Jinny Sanders, MD. Husband, Percell Miller gave verbal consent for CCM.  Ms. Selzler was given information about Chronic Care Management services today including:  1. CCM service includes personalized support from designated clinical staff supervised by her physician, including individualized plan of care and coordination with other care providers 2. 24/7 contact phone numbers for assistance for urgent and routine care needs. 3. Service will only be billed when office clinical staff spend 20 minutes or more in a month to coordinate care. 4. Only one practitioner may furnish and bill the service in a calendar month. 5. The patient may stop CCM services at any time (effective at the end of the month) by phone call to the office staff.   Patient agreed to services and verbal consent obtained.   Follow up plan:   Raynicia Dukes [employer] Scheduler

## 2019-08-25 ENCOUNTER — Other Ambulatory Visit: Payer: Self-pay | Admitting: Family Medicine

## 2019-08-25 NOTE — Telephone Encounter (Signed)
Last office visit 02/08/2019 for [place].  Last refilled 02/04/2018 for 15 g with no refills.  No future appointments with PCP.

## 2019-09-05 ENCOUNTER — Telehealth: Payer: Self-pay

## 2019-09-05 DIAGNOSIS — K2100 Gastro-esophageal reflux disease with esophagitis, without bleeding: Secondary | ICD-10-CM

## 2019-09-05 DIAGNOSIS — I10 Essential (primary) hypertension: Secondary | ICD-10-CM

## 2019-09-05 NOTE — Telephone Encounter (Signed)
I would like to request a referral for Claire Lewis to chronic care management pharmacy services for the following conditions:   Essential hypertension, benign  [I10]  GERD [K21.9]  [name], PharmD Clinical Pharmacist [facility] [phone]

## 2019-09-05 NOTE — Telephone Encounter (Signed)
Please sign referral

## 2019-09-09 ENCOUNTER — Telehealth: Payer: PPO

## 2019-09-09 NOTE — Chronic Care Management (AMB) (Deleted)
Chronic Care Management Pharmacy  Name: Claire Lewis  MRN: [id number] DOB: [birth year]  Chief Complaint/ HPI  Claire Lewis,  74 y.o., female presents for their Initial CCM visit with the clinical pharmacist via telephone.  PCP : Jinny Sanders, MD  Their chronic conditions include: hypertension, allergic rhinitis, GERD, obesity, pre-diabetes hypercholesterolemia  Patient concerns:  Office Visits: 02/08/19: Claire Lewis - continue current medications, improve cholesterol, low carb diet, schedule mammogram   Consult Visit: 06/27/19: GERD - no note 05/06/19: Cataract - no note  No Known Allergies  Medications: Outpatient Encounter Medications as of 09/09/2019  Medication Sig  . halobetasol (ULTRAVATE) 0.05 % ointment APPLY TO AFFECTED AREA TWICE A DAY  . amLODipine (NORVASC) 5 MG tablet Take 1 tablet (5 mg total) by mouth daily.  Marland Kitchen atorvastatin (LIPITOR) 20 MG tablet Take 1 tablet (20 mg total) by mouth daily.  . Calcium Carbonate-Vitamin D (CALCIUM 600+D) 600-400 MG-UNIT per tablet Take 1 tablet by mouth daily.  . clobetasol ointment (TEMOVATE) 0.05 % Apply topically as needed.  Marland Kitchen estradiol (ESTRACE) 0.5 MG tablet Take 1 tablet by mouth every other day  . metoprolol succinate (TOPROL-XL) 50 MG 24 hr tablet Take 1 tablet by mouth daily with or immediately following a meal  . Omega-3 Fatty Acids (FISH OIL) 1000 MG CPDR Take 1 tablet by mouth daily.  Marland Kitchen omeprazole (PRILOSEC) 40 MG capsule Take 40 mg by mouth daily.   No facility-administered encounter medications on file as of 09/09/2019.   Current Diagnosis/Assessment:  Hypertension   CMP Latest Ref Rng & Units 02/01/2019 01/26/2018 01/14/2017  Glucose 70 - 99 mg/dL 105(H) 99 97  BUN 6 - 23 mg/dL 18 15 18   Creatinine 0.40 - 1.20 mg/dL 0.86 0.90 0.89  Sodium 135 - 145 mEq/L 141 141 140  Potassium 3.5 - 5.1 mEq/L 4.0 4.3 4.4  Chloride 96 - 112 mEq/L 106 106 107  CO2 19 - 32 mEq/L 28 30 29   Calcium 8.4 - 10.5 mg/dL 9.1 9.6 9.4    Total Protein 6.0 - 8.3 g/dL 6.4 6.7 6.7  Total Bilirubin 0.2 - 1.2 mg/dL 0.6 0.6 0.6  Alkaline Phos 39 - 117 U/L 76 67 67  AST 0 - 37 U/L 16 20 19   ALT 0 - 35 U/L 14 17 17    Office blood pressures are: BP Readings from Last 3 Encounters:  02/08/19 (!) 142/70  02/02/18 140/60  01/26/18 136/82   BP today is:  {CHL HP [employer]STREAM Pharmacist BP [phone]}  Patient has failed these meds in the past:  Patient checks BP at home {CHL HP BP Monitoring Frequency:[phone]} Patient home BP readings are ranging:   Patient is currently {CHL Controlled/[phone]} on the following medications:   Amlodipine 5 mg - 1 tablet daily  Metoprolol succinate 50 mg - 1 tablet daily with meal  We discussed:  {CHL HP [facility] Pharmacy discussion:[phone]}  Plan: Continue {CHL HP [facility] Pharmacy [phone]}  Hyperlipidemia   Lipid Panel     Component Value Date/Time   CHOL 175 02/01/2019 0917   TRIG 197.0 (H) 02/01/2019 0917   HDL 44.60 02/01/2019 0917   CHOLHDL 4 02/01/2019 0917   VLDL 39.4 02/01/2019 0917   LDLCALC 91 02/01/2019 0917    The 10-year ASCVD risk score Mikey Bussing DC Jr., et al., 2013) is: 22.7%   Values used to calculate the score:     Age: 62 years     Sex: Female     Is Non-Hispanic African American: No  Diabetic: No     Tobacco smoker: No     Systolic Blood Pressure: [id number] mmHg     Is BP treated: Yes     HDL Cholesterol: 44.6 mg/dL     Total Cholesterol: 175 mg/dL   LDL goal < 100 Patient has failed these meds in past: *** Patient is currently {CHL Controlled/[phone]} on the following medications: ***  Atorvastatin 20 mg - 1 tablet daily  Omega 3 fatty acid 1000 mg - 1 tablet daily  We discussed:  {CHL HP [facility] Pharmacy discussion:[phone]}  Plan: Continue {CHL HP [facility] Pharmacy [phone]}  GERD   Patient has failed these meds in past:  Patient is currently controlled on the following medications:    Omeprazole 40 mg - 1 tablet daily  Assessment:  . Symptoms:  . Duration of therapy: . Pertinent history: None of the following noted --> GI bleed, Barrett's esophagus, severe esophagitis, long-term NSAID use or DAPT with additional bleeding risk . Risks of long-term PPI therapy reviewed: B12 deficiency, hypomagnesemia, pneumonia, fractures, enteric infections  Plan: Continue current therapy. Consider PPI taper annually.  Plan: Recommend beginning PPI taper. Will consult with PCP. Monitor symptoms.   Medication Management  Misc: halobetasol 0.05% ointment - apply BID, estradiol 0.5 mg tablet - 1 tablet every other day  OTCs: calcium/vitamin D 600-400 mg-unit - 1 tablet daily  [facility]  Adherence:  Social support:  Affordability:  Vaccines:  CCM Follow Up:  [name], PharmD Clinical Pharmacist [place] [facility] [phone]

## 2019-10-25 NOTE — Chronic Care Management (AMB) (Signed)
Chronic Care Management Pharmacy  Name: Claire Lewis  MRN: [id number] DOB: 12-12-1945  Chief Complaint/ HPI  Claire Lewis,  74 y.o., female presents for their Initial CCM visit with the clinical pharmacist via telephone.  PCP : Jinny Sanders, MD  Their chronic conditions include: hypertension, allergic rhinitis, GERD, hypercholesterolemia, pre-diabetes  Patient concerns: denies medication concerns/questions  Office Visits:  02/08/19: Diona Browner, AWV - cont current medications   Consult Visit:  06/27/19: Gastroenterology - note unavailable  11/20.20: Age-related nuclear cataracts- surgery for cataracts  No Known Allergies  Medications: Outpatient Encounter Medications as of 10/27/2019  Medication Sig  . amLODipine (NORVASC) 5 MG tablet Take 1 tablet (5 mg total) by mouth daily.  Marland Kitchen atorvastatin (LIPITOR) 20 MG tablet Take 1 tablet (20 mg total) by mouth daily.  . Calcium Carbonate-Vitamin D (CALCIUM 600+D) 600-400 MG-UNIT per tablet Take 1 tablet by mouth daily.  . clobetasol ointment (TEMOVATE) 0.05 % Apply topically as needed.  Marland Kitchen estradiol (ESTRACE) 0.5 MG tablet Take 1 tablet by mouth every other day  . halobetasol (ULTRAVATE) 0.05 % ointment APPLY TO AFFECTED AREA TWICE A DAY  . metoprolol succinate (TOPROL-XL) 50 MG 24 hr tablet Take 1 tablet by mouth daily with or immediately following a meal  . Omega-3 Fatty Acids (FISH OIL) 1000 MG CPDR Take 1 tablet by mouth daily.  Marland Kitchen omeprazole (PRILOSEC) 40 MG capsule Take 40 mg by mouth daily.   No facility-administered encounter medications on file as of 10/27/2019.   Current Diagnosis/Assessment:   Emergency planning/management officer Strain: Low Risk   . Difficulty of Paying Living Expenses: Not very hard   Goals Addressed            This Visit's Progress   . Pharmacy Care Plan       CARE PLAN ENTRY  Current Barriers:  . Chronic Disease Management support, education, and care coordination needs related to Hypertension and  Hyperlipidemia   Hypertension . Pharmacist Clinical Goal(s): o Over the next 6 months, patient will work with PharmD and providers to achieve BP goal <130/80 mmHg . Current regimen:  o Amlodipine 5 mg - 1 tablet daily o Metoprolol succinate 50 mg - 1 tablet daily with meal o Currently checking blood pressure at home less than once per month . Interventions: o Counseled on blood pressure goals, would like to target lower goal of 130/80 mmHg to reduce cardiovascular risk if tolerated. Discussed potential benefit with alternative blood pressure medications to replace metoprolol.  . Patient self care activities - Over the next 6 months, patient will: o Check BP 3-4 days this month prior to morning medications, document, and contact pharmacist if blood pressure if above 130/80 mmHg o Ensure daily salt intake < 2300 mg/day  Hyperlipidemia . Pharmacist Clinical Goal(s): o Over the next 6 months, patient will work with PharmD and providers to maintain LDL goal < 100 (most recent LDL: 91) . Current regimen:  o Atorvastatin 20 mg - 1 tablet daily o Omega 3 fatty acid 1200 mg - 1 capsule daily . Interventions: o Continue current medications . Patient self care activities - Over the next 6 months, patient will: o Work towards heart healthy exercise goal of 30 minutes of moderate level activity, such as brisk walking, 5 days per week  Additional recommendations: . Continue taper off estradiol 0.5 mg. Currently taking 0.5 mg tablet every 3 days. May try cutting tablet in half and taking 0.25 mg every 3 days. Continue to reduce dose  as tolerated. . Recommend the following vaccinations from your local pharmacy: shingles vaccine (Shingrix 2 dose series for all adult 50+), tetanus vaccine (Tdap - due every 10 years, previous dose 06/19/2009)  Initial goal documentation      Hypertension   CMP Latest Ref Rng & Units 02/01/2019 01/26/2018 01/14/2017  Glucose 70 - 99 mg/dL 105(H) 99 97  BUN 6 - 23 mg/dL  18 15 18   Creatinine 0.40 - 1.20 mg/dL 0.86 0.90 0.89  Sodium 135 - 145 mEq/L 141 141 140  Potassium 3.5 - 5.1 mEq/L 4.0 4.3 4.4  Chloride 96 - 112 mEq/L 106 106 107  CO2 19 - 32 mEq/L 28 30 29   Calcium 8.4 - 10.5 mg/dL 9.1 9.6 9.4  Total Protein 6.0 - 8.3 g/dL 6.4 6.7 6.7  Total Bilirubin 0.2 - 1.2 mg/dL 0.6 0.6 0.6  Alkaline Phos 39 - 117 U/L 76 67 67  AST 0 - 37 U/L 16 20 19   ALT 0 - 35 U/L 14 17 17    Office blood pressures are: BP Readings from Last 3 Encounters:  02/08/19 (!) 142/70  02/02/18 140/60  01/26/18 136/82   Patient has failed these meds in the past: none  Patient checks BP at home: arm blood pressure cuff (less than once a month)  Patient home BP readings are ranging: 130/75  BP goal < 130/80 mmHg Patient is currently controlled on the following medications:   Amlodipine 5 mg - 1 tablet daily  Metoprolol succinate 50 mg - 1 tablet daily with meal  We discussed: Denies fatigue or adverse effects, reports metoprolol started prior to seeing Dr. Diona Browner  CV risk: pre-diabetes assessed, no A1c per chart  Plan: Continue current medications; Check BP 3-4 times this month and call if BP > 130/80 mmHg.  Hyperlipidemia   Lipid Panel     Component Value Date/Time   CHOL 175 02/01/2019 0917   TRIG 197.0 (H) 02/01/2019 0917   HDL 44.60 02/01/2019 0917   CHOLHDL 4 02/01/2019 0917   VLDL 39.4 02/01/2019 0917   LDLCALC 91 02/01/2019 0917    The 10-year ASCVD risk score Mikey Bussing DC Jr., et al., 2013) is: 22.7%   Values used to calculate the score:     Age: 74 years     Sex: Female     Is Non-Hispanic African American: No     Diabetic: No     Tobacco smoker: No     Systolic Blood Pressure: [id number] mmHg     Is BP treated: Yes     HDL Cholesterol: 44.6 mg/dL     Total Cholesterol: 175 mg/dL   LDL goal < 100 Patient has failed these meds in past: none Patient is currently controlled on the following medications:   Atorvastatin 20 mg - 1 tablet daily  Omega 3  fatty acids 1200 mg - 1 capsule daily   We discussed: confirmed adherence  Plan: Continue current medications; Work towards exercise goal of 150 minutes per week.  GERD   Patient has failed these meds in past: none reported Patient is currently controlled on the following medications:   Omeprazole 40 mg - 1 capsule daily 30 minutes before breakfast  We discussed: Previously had difficulty swallowing but improved with omeprazole. Reports skipping doses occasionally per GI recommendation to see how she is doing/whether she still needs it.  Plan: Continue current medications  Vaccines   Reviewed and discussed patient's vaccination history.    Immunization History  Administered Date(s) Administered  .  Fluad Quad(high Dose 65+) 02/08/2019  . Influenza Whole 04/16/2010  . Influenza, High Dose Seasonal PF 06/15/2015, 04/13/2018  . Pneumococcal Conjugate-13 11/07/2014  . Pneumococcal Polysaccharide-23 10/04/2013  . Td 06/19/2009  . Zoster 05/16/2008   Plan: Recommended patient receive Shingrix, Tdap; Reports COVID-19 received 07/2019.   Medication Management  Misc: clobetasol 0.05% ointment, halobetasol 0.05% ointment - applies vaginally PRN dryness/itchiness, Estrace 0.5 mg tablet - 1 every other day (last filled 06/07/19 #45) - self weaning, currently taking every 3 days, recommended trying to cut to 1/2 tab of 0.5 mg every 3 days  OTCs: vitamin D/calcium 400 IU - 600 mg 1 tablet daily (last dex 2017 - normal)  Pharmacy/Benefits: [facility]  Adherence: does not use pillbox, denies missed doses   Affordability: denies cost concerns, denies financial concerns   CCM Follow Up: 6 months - 04/27/20 at 10:00 AM (telephone)  [name], PharmD Clinical Pharmacist [facility] [phone]

## 2019-10-27 ENCOUNTER — Ambulatory Visit: Payer: PPO

## 2019-10-27 ENCOUNTER — Other Ambulatory Visit: Payer: Self-pay

## 2019-10-27 DIAGNOSIS — I10 Essential (primary) hypertension: Secondary | ICD-10-CM

## 2019-10-27 DIAGNOSIS — E7800 Pure hypercholesterolemia, unspecified: Secondary | ICD-10-CM

## 2019-10-27 NOTE — Patient Instructions (Signed)
Oct 27, 2019  Dear Claire Lewis,  It was a pleasure meeting you during our initial appointment on Oct 27, 2019. Below is a summary of the goals we discussed and components of chronic care management. Please contact me anytime with questions or concerns.   Visit Information  Goals Addressed            This Visit's Progress   . Pharmacy Care Plan       CARE PLAN ENTRY  Current Barriers:  . Chronic Disease Management support, education, and care coordination needs related to Hypertension and Hyperlipidemia   Hypertension . Pharmacist Clinical Goal(s): o Over the next 6 months, patient will work with PharmD and providers to achieve BP goal <130/80 mmHg . Current regimen:  o Amlodipine 5 mg - 1 tablet daily o Metoprolol succinate 50 mg - 1 tablet daily with meal o Currently checking blood pressure at home less than once per month . Interventions: o Counseled on blood pressure goals, would like to target lower goal of 130/80 mmHg to reduce cardiovascular risk if tolerated. Discussed potential benefit with alternative blood pressure medications to replace metoprolol.  . Patient self care activities - Over the next 6 months, patient will: o Check BP 3-4 days this month prior to morning medications, document, and contact pharmacist if blood pressure if above 130/80 mmHg o Ensure daily salt intake < 2300 mg/day  Hyperlipidemia . Pharmacist Clinical Goal(s): o Over the next 6 months, patient will work with PharmD and providers to maintain LDL goal < 100 (most recent LDL: 91) . Current regimen:  o Atorvastatin 20 mg - 1 tablet daily o Omega 3 fatty acid 1200 mg - 1 capsule daily . Interventions: o Continue current medications . Patient self care activities - Over the next 6 months, patient will: o Work towards heart healthy exercise goal of 30 minutes of moderate level activity, such as brisk walking, 5 days per week  Additional recommendations: . Continue taper off estradiol 0.5  mg. Currently taking 0.5 mg tablet every 3 days. May try cutting tablet in half and taking 0.25 mg every 3 days. Continue to reduce dose as tolerated. . Recommend the following vaccinations from your local pharmacy: shingles vaccine (Shingrix 2 dose series for all adult 50+), tetanus vaccine (Tdap - due every 10 years, previous dose 06/19/2009)  Initial goal documentation      Claire Lewis was given information about Chronic Care Management services today including:  1. CCM service includes personalized support from designated clinical staff supervised by her physician, including individualized plan of care and coordination with other care providers 2. 24/7 contact phone numbers for assistance for urgent and routine care needs. 3. Standard insurance, coinsurance, copays and deductibles apply for chronic care management only during months in which we provide at least 20 minutes of these services. Most insurances cover these services at 100%, however patients may be responsible for any copay, coinsurance and/or deductible if applicable. This service may help you avoid the need for more expensive face-to-face services. 4. Only one practitioner may furnish and bill the service in a calendar month. 5. The patient may stop CCM services at any time (effective at the end of the month) by phone call to the office staff.  Patient agreed to services and verbal consent obtained.   The patient verbalized understanding of instructions provided today and agreed to receive a mailed copy of patient instruction and/or educational materials. Telephone follow up appointment with pharmacy team member scheduled for:  04/27/20 at 10:00 AM (telephone)  [name], PharmD Clinical Pharmacist [facility] at [facility] [phone]   [place] stands for "Dietary Approaches to Stop Hypertension." The DASH eating plan is a healthy eating plan that has been shown to reduce high blood pressure  (hypertension). It may also reduce your risk for type 2 diabetes, heart disease, and stroke. The DASH eating plan may also help with weight loss. What are tips for following this plan?  General guidelines  Avoid eating more than 2,300 mg (milligrams) of salt (sodium) a day. If you have hypertension, you may need to reduce your sodium intake to 1,500 mg a day.  Limit alcohol intake to no more than 1 drink a day for nonpregnant women and 2 drinks a day for men. One drink equals 12 oz of beer, 5 oz of wine, or 1 oz of hard liquor.  Work with your health care provider to maintain a healthy body weight or to lose weight. Ask what an ideal weight is for you.  Get at least 30 minutes of exercise that causes your heart to beat faster (aerobic exercise) most days of the week. Activities may include walking, swimming, or biking.  Work with your health care provider or diet and nutrition specialist (dietitian) to adjust your eating plan to your individual calorie needs. Reading food labels   Check food labels for the amount of sodium per serving. Choose foods with less than 5 percent of the Daily Value of sodium. Generally, foods with less than 300 mg of sodium per serving fit into this eating plan.  To find whole grains, look for the word "whole" as the first word in the ingredient list. Shopping  Buy products labeled as "low-sodium" or "no salt added."  Buy fresh foods. Avoid canned foods and premade or frozen meals. Cooking  Avoid adding salt when cooking. Use salt-free seasonings or herbs instead of table salt or sea salt. Check with your health care provider or pharmacist before using salt substitutes.  Do not fry foods. Cook foods using healthy methods such as baking, boiling, grilling, and broiling instead.  Cook with heart-healthy oils, such as olive, canola, soybean, or sunflower oil. Meal planning  Eat a balanced diet that includes: ? 5 or more servings of fruits and vegetables  each day. At each meal, try to fill half of your plate with fruits and vegetables. ? Up to 6-8 servings of whole grains each day. ? Less than 6 oz of lean meat, poultry, or fish each day. A 3-oz serving of meat is about the same size as a deck of cards. One egg equals 1 oz. ? 2 servings of low-fat dairy each day. ? A serving of nuts, seeds, or beans 5 times each week. ? Heart-healthy fats. Healthy fats called Omega-3 fatty acids are found in foods such as flaxseeds and coldwater fish, like sardines, salmon, and mackerel.  Limit how much you eat of the following: ? Canned or prepackaged foods. ? Food that is high in trans fat, such as fried foods. ? Food that is high in saturated fat, such as fatty meat. ? Sweets, desserts, sugary drinks, and other foods with added sugar. ? Full-fat dairy products.  Do not salt foods before eating.  Try to eat at least 2 vegetarian meals each week.  Eat more home-cooked food and less restaurant, buffet, and fast food.  When eating at a restaurant, ask that your food be prepared with less salt or no  salt, if possible. What foods are recommended? The items listed may not be a complete list. Talk with your dietitian about what dietary choices are best for you. Grains Whole-grain or whole-wheat bread. Whole-grain or whole-wheat pasta. Brown rice. [place]. Bulgur. Whole-grain and low-sodium cereals. Pita bread. Low-fat, low-sodium crackers. Whole-wheat flour tortillas. Vegetables Fresh or frozen vegetables (raw, steamed, roasted, or grilled). Low-sodium or reduced-sodium tomato and vegetable juice. Low-sodium or reduced-sodium tomato sauce and tomato paste. Low-sodium or reduced-sodium canned vegetables. Fruits All fresh, dried, or frozen fruit. Canned fruit in natural juice (without added sugar). Meat and other protein foods Skinless chicken or [place]. Ground chicken or [place]. Pork with fat trimmed off. Fish and seafood. Egg whites. Dried beans,  peas, or lentils. Unsalted nuts, nut butters, and seeds. Unsalted canned beans. Lean cuts of beef with fat trimmed off. Low-sodium, lean deli meat. Dairy Low-fat (1%) or fat-free (skim) milk. Fat-free, low-fat, or reduced-fat cheeses. Nonfat, low-sodium ricotta or cottage cheese. Low-fat or nonfat yogurt. Low-fat, low-sodium cheese. Fats and oils Soft margarine without trans fats. Vegetable oil. Low-fat, reduced-fat, or light mayonnaise and salad dressings (reduced-sodium). Canola, safflower, olive, soybean, and sunflower oils. Avocado. Seasoning and other foods Herbs. Spices. Seasoning mixes without salt. Unsalted popcorn and pretzels. Fat-free sweets. What foods are not recommended? The items listed may not be a complete list. Talk with your dietitian about what dietary choices are best for you. Grains Baked goods made with fat, such as croissants, muffins, or some breads. Dry pasta or rice meal packs. Vegetables Creamed or fried vegetables. Vegetables in a cheese sauce. Regular canned vegetables (not low-sodium or reduced-sodium). Regular canned tomato sauce and paste (not low-sodium or reduced-sodium). Regular tomato and vegetable juice (not low-sodium or reduced-sodium). Angie Fava. Olives. Fruits Canned fruit in a light or heavy syrup. Fried fruit. Fruit in cream or butter sauce. Meat and other protein foods Fatty cuts of meat. Ribs. Fried meat. Berniece Salines. Sausage. Bologna and other processed lunch meats. Salami. Fatback. Hotdogs. Bratwurst. Salted nuts and seeds. Canned beans with added salt. Canned or smoked fish. Whole eggs or egg yolks. Chicken or [place] with skin. Dairy Whole or 2% milk, cream, and half-and-half. Whole or full-fat cream cheese. Whole-fat or sweetened yogurt. Full-fat cheese. Nondairy creamers. Whipped toppings. Processed cheese and cheese spreads. Fats and oils Butter. Stick margarine. Lard. Shortening. Ghee. Bacon fat. Tropical oils, such as coconut, palm kernel, or palm  oil. Seasoning and other foods Salted popcorn and pretzels. Onion salt, garlic salt, seasoned salt, table salt, and sea salt. Worcestershire sauce. Tartar sauce. Barbecue sauce. Teriyaki sauce. Soy sauce, including reduced-sodium. Steak sauce. Canned and packaged gravies. Fish sauce. Oyster sauce. Cocktail sauce. Horseradish that you find on the shelf. Ketchup. Mustard. Meat flavorings and tenderizers. Bouillon cubes. Hot sauce and Tabasco sauce. Premade or packaged marinades. Premade or packaged taco seasonings. Relishes. Regular salad dressings. Where to find more information:  National Heart, Lung, and [place]: https://wilson-eaton.com/  American Heart Association: www.heart.org Summary  The DASH eating plan is a healthy eating plan that has been shown to reduce high blood pressure (hypertension). It may also reduce your risk for type 2 diabetes, heart disease, and stroke.  With the DASH eating plan, you should limit salt (sodium) intake to 2,300 mg a day. If you have hypertension, you may need to reduce your sodium intake to 1,500 mg a day.  When on the DASH eating plan, aim to eat more fresh fruits and vegetables, whole grains, lean proteins, low-fat dairy, and heart-healthy  fats.  Work with your health care provider or diet and nutrition specialist (dietitian) to adjust your eating plan to your individual calorie needs. This information is not intended to replace advice given to you by your health care provider. Make sure you discuss any questions you have with your health care provider. Document Revised: 05/15/2017 Document Reviewed: 05/26/2016 Elsevier Patient Education  2020 [employer].

## 2020-01-03 ENCOUNTER — Other Ambulatory Visit: Payer: Self-pay | Admitting: Family Medicine

## 2020-01-18 ENCOUNTER — Telehealth: Payer: Self-pay

## 2020-01-18 NOTE — Progress Notes (Addendum)
Unsuccessful outreach. Called patient for hypertension assessment at 3:53 PM. Left voicemail for pt to return call.   Martinique Uselman, [place] Pharmacist Assistant  [phone]

## 2020-02-27 ENCOUNTER — Other Ambulatory Visit: Payer: Self-pay | Admitting: Family Medicine

## 2020-02-27 ENCOUNTER — Telehealth: Payer: Self-pay | Admitting: Family Medicine

## 2020-02-27 DIAGNOSIS — E7800 Pure hypercholesterolemia, unspecified: Secondary | ICD-10-CM

## 2020-02-27 DIAGNOSIS — Z1231 Encounter for screening mammogram for malignant neoplasm of breast: Secondary | ICD-10-CM

## 2020-02-27 DIAGNOSIS — R7303 Prediabetes: Secondary | ICD-10-CM

## 2020-02-27 NOTE — Telephone Encounter (Signed)
-----   Message from Ellamae Sia sent at 02/14/2020 10:27 AM EDT ----- Regarding: Lab orders for Tuesday 9.14.21  AWV lab orders, please.

## 2020-02-28 ENCOUNTER — Other Ambulatory Visit (INDEPENDENT_AMBULATORY_CARE_PROVIDER_SITE_OTHER): Payer: PPO

## 2020-02-28 ENCOUNTER — Other Ambulatory Visit: Payer: Self-pay

## 2020-02-28 DIAGNOSIS — E7800 Pure hypercholesterolemia, unspecified: Secondary | ICD-10-CM | POA: Diagnosis not present

## 2020-02-28 DIAGNOSIS — R7303 Prediabetes: Secondary | ICD-10-CM

## 2020-02-28 LAB — COMPREHENSIVE METABOLIC PANEL
ALT: 17 U/L (ref 0–35)
AST: 18 U/L (ref 0–37)
Albumin: 4 g/dL (ref 3.5–5.2)
Alkaline Phosphatase: 79 U/L (ref 39–117)
BUN: 16 mg/dL (ref 6–23)
CO2: 27 mEq/L (ref 19–32)
Calcium: 9.3 mg/dL (ref 8.4–10.5)
Chloride: 105 mEq/L (ref 96–112)
Creatinine, Ser: 0.88 mg/dL (ref 0.40–1.20)
GFR: 62.72 mL/min (ref >60.00)
Glucose, Bld: 104 mg/dL — ABNORMAL HIGH (ref 70–99)
Potassium: 3.8 mEq/L (ref 3.5–5.1)
Sodium: 141 mEq/L (ref 135–145)
Total Bilirubin: 0.6 mg/dL (ref 0.2–1.2)
Total Protein: 7 g/dL (ref 6.0–8.3)

## 2020-02-28 LAB — LIPID PANEL
Cholesterol: 171 mg/dL (ref 0–200)
HDL: 46.5 mg/dL (ref >39.00)
LDL Cholesterol: 89 mg/dL (ref 0–99)
NonHDL: 124.26
Total CHOL/HDL Ratio: 4
Triglycerides: 176 mg/dL — ABNORMAL HIGH (ref 0.0–149.0)
VLDL: 35.2 mg/dL (ref 0.0–40.0)

## 2020-02-28 LAB — HEMOGLOBIN A1C: Hgb A1c MFr Bld: 5.8 % (ref 4.6–6.5)

## 2020-02-28 NOTE — Progress Notes (Signed)
No critical labs need to be addressed urgently. We will discuss labs in detail at upcoming office visit.   

## 2020-02-29 ENCOUNTER — Ambulatory Visit (INDEPENDENT_AMBULATORY_CARE_PROVIDER_SITE_OTHER): Payer: PPO

## 2020-02-29 DIAGNOSIS — Z0000 Encounter for general adult medical examination without abnormal findings: Secondary | ICD-10-CM

## 2020-02-29 NOTE — Progress Notes (Signed)
PCP notes:  Health Maintenance: Tdap- insurance Flu- due   Abnormal Screenings: none   Patient concerns: none   Nurse concerns: none   Next PCP appt.: 03/01/2020 @ 9:40 am

## 2020-02-29 NOTE — Patient Instructions (Signed)
[name] , Thank you for taking time to come for your Medicare Wellness Visit. I appreciate your ongoing commitment to your health goals. Please review the following plan we discussed and let me know if I can assist you in the future.   Screening recommendations/referrals: Colonoscopy: Up to date, completed 04/29/2018, due 04/2023 Mammogram: Up to date, completed 03/29/2019., due 03/2020 Bone Density: Up to date, completed 03/20/2016, due 2-5 years  Recommended yearly ophthalmology/optometry visit for glaucoma screening and checkup Recommended yearly dental visit for hygiene and checkup  Vaccinations: Influenza vaccine: due, will complete at office visit  Pneumococcal vaccine: Completed series Tdap vaccine: decline- insurance/financial Shingles vaccine: due, check with your insurance company regarding coverage    Covid-19:Completed series  Advanced directives: Advance directive discussed with you today. Even though you declined this today please call our office should you change your mind and we can give you the proper paperwork for you to fill out.   Conditions/risks identified: hypertension, hypercholesterolemia  Next appointment: Follow up in one year for your annual wellness visit    Preventive Care 43 Years and Older, Female Preventive care refers to lifestyle choices and visits with your health care provider that can promote health and wellness. What does preventive care include?  A yearly physical exam. This is also called an annual well check.  Dental exams once or twice a year.  Routine eye exams. Ask your health care provider how often you should have your eyes checked.  Personal lifestyle choices, including:  Daily care of your teeth and gums.  Regular physical activity.  Eating a healthy diet.  Avoiding tobacco and drug use.  Limiting alcohol use.  Practicing safe sex.  Taking low-dose aspirin every day.  Taking vitamin and mineral supplements as  recommended by your health care provider. What happens during an annual well check? The services and screenings done by your health care provider during your annual well check will depend on your age, overall health, lifestyle risk factors, and family history of disease. Counseling  Your health care provider may ask you questions about your:  Alcohol use.  Tobacco use.  Drug use.  Emotional well-being.  Home and relationship well-being.  Sexual activity.  Eating habits.  History of falls.  Memory and ability to understand (cognition).  Work and work Statistician.  Reproductive health. Screening  You may have the following tests or measurements:  Height, weight, and BMI.  Blood pressure.  Lipid and cholesterol levels. These may be checked every 5 years, or more frequently if you are over 86 years old.  Skin check.  Lung cancer screening. You may have this screening every year starting at age 67 if you have a 30-pack-year history of smoking and currently smoke or have quit within the past 15 years.  Fecal occult blood test (FOBT) of the stool. You may have this test every year starting at age 60.  Flexible sigmoidoscopy or colonoscopy. You may have a sigmoidoscopy every 5 years or a colonoscopy every 10 years starting at age 33.  Hepatitis C blood test.  Hepatitis B blood test.  Sexually transmitted disease (STD) testing.  Diabetes screening. This is done by checking your blood sugar (glucose) after you have not eaten for a while (fasting). You may have this done every 1-3 years.  Bone density scan. This is done to screen for osteoporosis. You may have this done starting at age 47.  Mammogram. This may be done every 1-2 years. Talk to your health care provider  about how often you should have regular mammograms. Talk with your health care provider about your test results, treatment options, and if necessary, the need for more tests. Vaccines  Your health care  provider may recommend certain vaccines, such as:  Influenza vaccine. This is recommended every year.  Tetanus, diphtheria, and acellular pertussis (Tdap, Td) vaccine. You may need a Td booster every 10 years.  Zoster vaccine. You may need this after age 74.  Pneumococcal 13-valent conjugate (PCV13) vaccine. One dose is recommended after age 40.  Pneumococcal polysaccharide (PPSV23) vaccine. One dose is recommended after age 30. Talk to your health care provider about which screenings and vaccines you need and how often you need them. This information is not intended to replace advice given to you by your health care provider. Make sure you discuss any questions you have with your health care provider. Document Released: 06/29/2015 Document Revised: 02/20/2016 Document Reviewed: 04/03/2015 Elsevier Interactive Patient Education  2017 [place] Prevention in the Home Falls can cause injuries. They can happen to people of all ages. There are many things you can do to make your home safe and to help prevent falls. What can I do on the outside of my home?  Regularly fix the edges of walkways and driveways and fix any cracks.  Remove anything that might make you trip as you walk through a door, such as a raised step or threshold.  Trim any bushes or trees on the path to your home.  Use bright outdoor lighting.  Clear any walking paths of anything that might make someone trip, such as rocks or tools.  Regularly check to see if handrails are loose or broken. Make sure that both sides of any steps have handrails.  Any raised decks and porches should have guardrails on the edges.  Have any leaves, snow, or ice cleared regularly.  Use sand or salt on walking paths during winter.  Clean up any spills in your garage right away. This includes oil or grease spills. What can I do in the bathroom?  Use night lights.  Install grab bars by the toilet and in the tub and shower. Do  not use towel bars as grab bars.  Use non-skid mats or decals in the tub or shower.  If you need to sit down in the shower, use a plastic, non-slip stool.  Keep the floor dry. Clean up any water that spills on the floor as soon as it happens.  Remove soap buildup in the tub or shower regularly.  Attach bath mats securely with double-sided non-slip rug tape.  Do not have throw rugs and other things on the floor that can make you trip. What can I do in the bedroom?  Use night lights.  Make sure that you have a light by your bed that is easy to reach.  Do not use any sheets or blankets that are too big for your bed. They should not hang down onto the floor.  Have a firm chair that has side arms. You can use this for support while you get dressed.  Do not have throw rugs and other things on the floor that can make you trip. What can I do in the kitchen?  Clean up any spills right away.  Avoid walking on wet floors.  Keep items that you use a lot in easy-to-reach places.  If you need to reach something above you, use a strong step stool that has a grab bar.  Keep electrical cords out of the way.  Do not use floor polish or wax that makes floors slippery. If you must use wax, use non-skid floor wax.  Do not have throw rugs and other things on the floor that can make you trip. What can I do with my stairs?  Do not leave any items on the stairs.  Make sure that there are handrails on both sides of the stairs and use them. Fix handrails that are broken or loose. Make sure that handrails are as long as the stairways.  Check any carpeting to make sure that it is firmly attached to the stairs. Fix any carpet that is loose or worn.  Avoid having throw rugs at the top or bottom of the stairs. If you do have throw rugs, attach them to the floor with carpet tape.  Make sure that you have a light switch at the top of the stairs and the bottom of the stairs. If you do not have them,  ask someone to add them for you. What else can I do to help prevent falls?  Wear shoes that:  Do not have high heels.  Have rubber bottoms.  Are comfortable and fit you well.  Are closed at the toe. Do not wear sandals.  If you use a stepladder:  Make sure that it is fully opened. Do not climb a closed stepladder.  Make sure that both sides of the stepladder are locked into place.  Ask someone to hold it for you, if possible.  Clearly mark and make sure that you can see:  Any grab bars or handrails.  First and last steps.  Where the edge of each step is.  Use tools that help you move around (mobility aids) if they are needed. These include:  Canes.  Walkers.  Scooters.  Crutches.  Turn on the lights when you go into a dark area. Replace any light bulbs as soon as they burn out.  Set up your furniture so you have a clear path. Avoid moving your furniture around.  If any of your floors are uneven, fix them.  If there are any pets around you, be aware of where they are.  Review your medicines with your doctor. Some medicines can make you feel dizzy. This can increase your chance of falling. Ask your doctor what other things that you can do to help prevent falls. This information is not intended to replace advice given to you by your health care provider. Make sure you discuss any questions you have with your health care provider. Document Released: 03/29/2009 Document Revised: 11/08/2015 Document Reviewed: 07/07/2014 Elsevier Interactive Patient Education  2017 [employer].

## 2020-02-29 NOTE — Progress Notes (Signed)
Subjective:   Claire Lewis is a 74 y.o. female who presents for Medicare Annual (Subsequent) preventive examination.  Review of Systems: N/A      I connected with the patient today by telephone and verified that I am speaking with the correct person using two identifiers. Location patient: home Location nurse: work Persons participating in the telephone visit: patient, nurse.   I discussed the limitations, risks, security and privacy concerns of performing an evaluation and management service by telephone and the availability of in person appointments. I also discussed with the patient that there may be a patient responsible charge related to this service. The patient expressed understanding and verbally consented to this telephonic visit.        Cardiac Risk Factors include: advanced age (>8men, >51 women);hypertension;Other (see comment), Risk factor comments: hypercholesterolemia     Objective:    Today's Vitals   There is no height or weight on file to calculate BMI.  Advanced Directives 02/29/2020 01/26/2018 01/14/2017 01/09/2016  Does Patient Have a Medical Advance Directive? No No No No  Would patient like information on creating a medical advance directive? No - Patient declined No - Patient declined No - Patient declined No - patient declined information    Current Medications (verified) Outpatient Encounter Medications as of 02/29/2020  Medication Sig  . amLODipine (NORVASC) 5 MG tablet Take 1 tablet by mouth once daily  . atorvastatin (LIPITOR) 20 MG tablet Take 1 tablet by mouth once daily  . Calcium Carbonate-Vitamin D (CALCIUM 600+D) 600-400 MG-UNIT per tablet Take 1 tablet by mouth daily.  . clobetasol ointment (TEMOVATE) 0.05 % Apply topically as needed.  Marland Kitchen estradiol (ESTRACE) 0.5 MG tablet Take 1 tablet by mouth every other day  . halobetasol (ULTRAVATE) 0.05 % ointment APPLY TO AFFECTED AREA TWICE A DAY  . metoprolol succinate (TOPROL-XL) 50 MG 24 hr tablet  Take 1 tablet by mouth once daily with or immediately following a meal  . Omega-3 Fatty Acids (FISH OIL) 1000 MG CPDR Take 1 tablet by mouth daily.  Marland Kitchen omeprazole (PRILOSEC) 40 MG capsule Take 40 mg by mouth daily.   No facility-administered encounter medications on file as of 02/29/2020.    Allergies (verified) Patient has no known allergies.   History: Past Medical History:  Diagnosis Date  . Allergy   . GERD (gastroesophageal reflux disease)   . Hypertension    Past Surgical History:  Procedure Laterality Date  . CATARACT EXTRACTION, BILATERAL  04/2019  . PARTIAL HYSTERECTOMY  1983   one ovary remains for endometriosis   Family History  Problem Relation Age of Onset  . Heart attack Father 39  . Coronary artery disease Father   . Cancer Mother        breast  . Dementia Mother   . Breast cancer Mother   . Cancer Brother        colon  . Cancer Paternal Grandmother        lung  . Breast cancer Paternal Grandmother   . Meniere's disease Daughter    Social History   Socioeconomic History  . Marital status: Married    Spouse name: Not on file  . Number of children: Not on file  . Years of education: Not on file  . Highest education level: Not on file  Occupational History  . 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 Smoking status: Never Smoker  . Smokeless tobacco: Never Used  Vaping Use  . Vaping Use: Never used  Substance and Sexual Activity  . Alcohol use: No  . Drug use: No  . Sexual activity: Never  Other Topics Concern  . Not on file  Social History Narrative   Regular exercise---walks 3-4 times a week   Diet: fruits and veggies, no fried foods, on weight watchers                  Social Determinants of Health   Financial Resource Strain: Low Risk   . Difficulty of Paying Living Expenses: Not hard at all  Food Insecurity: No Food Insecurity  . Worried About Charity fundraiser in the Last Year: Never true    . Ran Out of Food in the Last Year: Never true  Transportation Needs: No Transportation Needs  . Lack of Transportation (Medical): No  . Lack of Transportation (Non-Medical): No  Physical Activity: Inactive  . Days of Exercise per Week: 0 days  . Minutes of Exercise per Session: 0 min  Stress: No Stress Concern Present  . Feeling of Stress : Not at all  Social Connections:   . Frequency of Communication with Friends and Family: Not on file  . Frequency of Social Gatherings with Friends and Family: Not on file  . Attends Religious Services: Not on file  . Active Member of Clubs or Organizations: Not on file  . Attends Archivist Meetings: Not on file  . Marital Status: Not on file    Tobacco Counseling Counseling given: Not Answered   Clinical Intake:  Pre-visit preparation completed: Yes  Pain : No/denies pain     Nutritional Risks: None Diabetes: No  How often do you need to have someone help you when you read instructions, pamphlets, or other written materials from your doctor or pharmacy?: 1 - Never What is the last grade level you completed in school?: 12th  Diabetic: No Nutrition Risk Assessment:  Has the patient had any N/V/D within the last 2 months?  No  Does the patient have any non-healing wounds?  No  Has the patient had any unintentional weight loss or weight gain?  No   Diabetes:  Is the patient diabetic?  No  If diabetic, was a CBG obtained today?  N/A Did the patient bring in their glucometer from home?  N/A How often do you monitor your CBG's? N/A.   Financial Strains and Diabetes Management:  Are you having any financial strains with the device, your supplies or your medication? N/A.  Does the patient want to be seen by Chronic Care Management for management of their diabetes?  N/A Would the patient like to be referred to a Nutritionist or for Diabetic Management?  N/A    Interpreter Needed?: No  Information entered by ::  CJohnson, LPN   Activities of Daily Living In your present state of health, do you have any difficulty performing the following activities: 02/29/2020  Hearing? N  Vision? N  Difficulty concentrating or making decisions? N  Walking or climbing stairs? N  Dressing or bathing? N  Doing errands, shopping? N  Preparing Food and eating ? N  Using the Toilet? N  In the past six months, have you accidently leaked urine? N  Do you have problems with loss of bowel control? N  Managing your Medications? N  Managing your Finances? N  Housekeeping or managing your Housekeeping? N  Some recent data might be hidden    Patient Care Team: [place],  Mervyn Gay, MD as PCP - General Levonne Lapping as Consulting Physician (Dentistry) [name], [facility] as Pharmacist (Pharmacist)  Indicate any recent Medical Services you may have received from other than [facility] providers in the past year (date may be approximate).     Assessment:   This is a routine wellness examination for Claire Lewis.  Hearing/Vision screen  Hearing Screening   125Hz  250Hz  500Hz  1000Hz  2000Hz  3000Hz  4000Hz  6000Hz  8000Hz   Right ear:           Left ear:           Vision Screening Comments: Patient gets annual eye exams   Dietary issues and exercise activities discussed: Current Exercise Habits: The patient does not participate in regular exercise at present, Exercise limited by: None identified  Goals    . Increase physical activity     Starting 01/26/2018, I will continue to exercise for at least 30 min 3 days a week.      . Patient Stated     02/29/2020, I will maintain and continue medications as prescribed.     . Pharmacy Care Plan     CARE PLAN ENTRY  Current Barriers:  . Chronic Disease Management support, education, and care coordination needs related to Hypertension and Hyperlipidemia   Hypertension . Pharmacist Clinical Goal(s): o Over the next 6 months, patient will work with PharmD and providers to achieve BP goal  <130/80 mmHg . Current regimen:  o Amlodipine 5 mg - 1 tablet daily o Metoprolol succinate 50 mg - 1 tablet daily with meal o Currently checking blood pressure at home less than once per month . Interventions: o Counseled on blood pressure goals, would like to target lower goal of 130/80 mmHg to reduce cardiovascular risk if tolerated. Discussed potential benefit with alternative blood pressure medications to replace metoprolol.  . Patient self care activities - Over the next 6 months, patient will: o Check BP 3-4 days this month prior to morning medications, document, and contact pharmacist if blood pressure if above 130/80 mmHg o Ensure daily salt intake < 2300 mg/day  Hyperlipidemia . Pharmacist Clinical Goal(s): o Over the next 6 months, patient will work with PharmD and providers to maintain LDL goal < 100 (most recent LDL: 91) . Current regimen:  o Atorvastatin 20 mg - 1 tablet daily o Omega 3 fatty acid 1200 mg - 1 capsule daily . Interventions: o Continue current medications . Patient self care activities - Over the next 6 months, patient will: o Work towards heart healthy exercise goal of 30 minutes of moderate level activity, such as brisk walking, 5 days per week  Additional recommendations: . Continue taper off estradiol 0.5 mg. Currently taking 0.5 mg tablet every 3 days. May try cutting tablet in half and taking 0.25 mg every 3 days. Continue to reduce dose as tolerated. . Recommend the following vaccinations from your local pharmacy: shingles vaccine (Shingrix 2 dose series for all adult 50+), tetanus vaccine (Tdap - due every 10 years, previous dose 06/19/2009)  Initial goal documentation      Depression Screen PHQ 2/9 Scores 02/29/2020 02/08/2019 01/26/2018 01/14/2017 01/09/2016 11/07/2014 10/04/2013  PHQ - 2 Score 0 0 0 0 0 0 0  PHQ- 9 Score 0 - 0 - - - -    Fall Risk Fall Risk  02/29/2020 05/09/2019 02/08/2019 01/26/2018 01/14/2017  Falls in the past year? 0 0 0 No No    Comment - Emmi Telephone Survey: data to providers prior to load - - -  Number falls in past yr: 0 - - - -  Injury with Fall? 0 - - - -  Risk for fall due to : Medication side effect - - - -  Follow up Falls evaluation completed;Falls prevention discussed - - - -    Any stairs in or around the home? Yes  If so, are there any without handrails? No  Home free of loose throw rugs in walkways, pet beds, electrical cords, etc? Yes  Adequate lighting in your home to reduce risk of falls? Yes   ASSISTIVE DEVICES UTILIZED TO PREVENT FALLS:  Life alert? No  Use of a cane, walker or w/c? No  Grab bars in the bathroom? No  Shower chair or bench in shower? No  Elevated toilet seat or a handicapped toilet? No   TIMED UP AND GO:  Was the test performed? N/A, telephonic visit .    Cognitive Function: MMSE - Mini Mental State Exam 02/29/2020 01/26/2018 01/14/2017 01/09/2016  Orientation to time 5 5 5 5   Orientation to Place 5 5 5 5   Registration 3 3 3 3   Attention/ Calculation 5 0 0 0  Recall 3 3 3 3   Language- name 2 objects - 0 0 0  Language- repeat 1 1 1 1   Language- follow 3 step command - 3 3 3   Language- read & follow direction - 0 0 0  Write a sentence - 0 0 0  Copy design - 0 0 0  Total score - 20 20 20   Mini Cog  Mini-Cog screen was completed. Maximum score is 22. A value of 0 denotes this part of the MMSE was not completed or the patient failed this part of the Mini-Cog screening.       Immunizations Immunization History  Administered Date(s) Administered  . Fluad Quad(high Dose 65+) 02/08/2019  . Influenza Whole 04/16/2010  . Influenza, High Dose Seasonal PF 06/15/2015, 04/13/2018  . PFIZER SARS-COV-2 Vaccination 07/23/2019, 08/13/2019  . Pneumococcal Conjugate-13 11/07/2014  . Pneumococcal Polysaccharide-23 10/04/2013  . Td 06/19/2009  . Zoster 05/16/2008    TDAP status: Due, Education has been provided regarding the importance of this vaccine. Advised may receive  this vaccine at local pharmacy or Health Dept. Aware to provide a copy of the vaccination record if obtained from local pharmacy or Health Dept. Verbalized acceptance and understanding. Flu Vaccine status: due, will get at upcoming visit  Pneumococcal vaccine status: Up to date Covid-19 vaccine status: Completed vaccines  Qualifies for Shingles Vaccine? Yes   Zostavax completed Yes   Shingrix Completed?: No.    Education has been provided regarding the importance of this vaccine. Patient has been advised to call insurance company to determine out of pocket expense if they have not yet received this vaccine. Advised may also receive vaccine at local pharmacy or Health Dept. Verbalized acceptance and understanding.  Screening Tests Health Maintenance  Topic Date Due  . INFLUENZA VACCINE  01/15/2020  . TETANUS/TDAP  02/28/2022 (Originally 06/20/2019)  . MAMMOGRAM  03/28/2021  . COLONOSCOPY  04/30/2023  . DEXA SCAN  Completed  . COVID-19 Vaccine  Completed  . Hepatitis C Screening  Completed  . PNA vac Low Risk Adult  Completed    Health Maintenance  Health Maintenance Due  Topic Date Due  . INFLUENZA VACCINE  01/15/2020    Colorectal cancer screening: Completed 04/29/2018. Repeat every 5 years Mammogram status: Completed 03/29/2019. Repeat every year Bone Density status: Completed 03/20/2016. Results reflect: Bone density results: NORMAL. Repeat  every 2-5 years.  Lung Cancer Screening: (Low Dose CT Chest recommended if Age 44-80 years, 30 pack-year currently smoking OR have quit w/in 15years.) does not qualify.    Additional Screening:  Hepatitis C Screening: does qualify; Completed 01/01/2016  Vision Screening: Recommended annual ophthalmology exams for early detection of glaucoma and other disorders of the eye. Is the patient up to date with their annual eye exam?  Yes  Who is the provider or what is the name of the office in which the patient attends annual eye exams? Dr. Luretha Rued If pt is not established with a provider, would they like to be referred to a provider to establish care? No .   Dental Screening: Recommended annual dental exams for proper oral hygiene  Community Resource Referral / Chronic Care Management: CRR required this visit?  No   CCM required this visit?  No      Plan:     I have personally reviewed and noted the following in the patient's chart:   . Medical and social history . Use of alcohol, tobacco or illicit drugs  . Current medications and supplements . Functional ability and status . Nutritional status . Physical activity . Advanced directives . List of other physicians . Hospitalizations, surgeries, and ER visits in previous 12 months . Vitals . Screenings to include cognitive, depression, and falls . Referrals and appointments  In addition, I have reviewed and discussed with patient certain preventive protocols, quality metrics, and best practice recommendations. A written personalized care plan for preventive services as well as general preventive health recommendations were provided to patient.   Due to this being a telephonic visit, the after visit summary with patients personalized plan was offered to patient via mail or my-chart.  Patient preferred to pick up at office at next visit.   Andrez Grime, LPN   [id number]

## 2020-03-01 ENCOUNTER — Encounter: Payer: Self-pay | Admitting: Family Medicine

## 2020-03-01 ENCOUNTER — Ambulatory Visit (INDEPENDENT_AMBULATORY_CARE_PROVIDER_SITE_OTHER): Payer: PPO | Admitting: Family Medicine

## 2020-03-01 ENCOUNTER — Other Ambulatory Visit: Payer: Self-pay

## 2020-03-01 VITALS — BP 130/72 | HR 62 | Temp 98.2°F | Ht 65.5 in | Wt 211.0 lb

## 2020-03-01 DIAGNOSIS — I10 Essential (primary) hypertension: Secondary | ICD-10-CM

## 2020-03-01 DIAGNOSIS — Z0000 Encounter for general adult medical examination without abnormal findings: Secondary | ICD-10-CM | POA: Diagnosis not present

## 2020-03-01 DIAGNOSIS — E7800 Pure hypercholesterolemia, unspecified: Secondary | ICD-10-CM

## 2020-03-01 DIAGNOSIS — Z23 Encounter for immunization: Secondary | ICD-10-CM

## 2020-03-01 DIAGNOSIS — E669 Obesity, unspecified: Secondary | ICD-10-CM

## 2020-03-01 DIAGNOSIS — R7303 Prediabetes: Secondary | ICD-10-CM | POA: Diagnosis not present

## 2020-03-01 NOTE — Progress Notes (Signed)
Chief Complaint  Patient presents with  . Annual Exam    Part 2    History of Present Illness: HPI The patient presents for complete physical and review of chronic health problems. He/She also has the following acute concerns today: none   She has been under stress I last year.. daughter with MALT ( lymphoma).  Son in law passed away as well  The patient saw a LPN or RN for medicare wellness visit.  Prevention and wellness was reviewed in detail. Note reviewed and important notes copied below.  PCP notes: Health Maintenance: Tdap- insurance Flu- due Abnormal Screenings: none    Clinical Support from 02/29/2020 in [place] at [facility] Total Score 0      Hypertension:   At goal on amlodipine BP Readings from Last 3 Encounters:  03/01/20 130/72  02/08/19 (!) 142/70  02/02/18 140/60  Using medication without problems or lightheadedness:  none Chest pain with exertion:none Edema:none Short of breath:none Average home BPs: Other issues:  Elevated Cholesterol:  LDL at goal on atorvastatin. Lab Results  Component Value Date   CHOL 171 02/28/2020   HDL 46.50 02/28/2020   LDLCALC 89 02/28/2020   TRIG 176.0 (H) 02/28/2020   CHOLHDL 4 02/28/2020  Using medications without problems:none Muscle aches: none Diet compliance: moderate Exercise:minimal. Other complaints: Obesity Body mass index is 34.58 kg/m.  Wt Readings from Last 3 Encounters:  03/01/20 211 lb (95.7 kg)  02/08/19 198 lb 12 oz (90.2 kg)  02/02/18 192 lb (87.1 kg)     prediabetes: Lab Results  Component Value Date   HGBA1C 5.8 02/28/2020     This visit occurred during the SARS-CoV-2 public health emergency.  Safety protocols were in place, including screening questions prior to the visit, additional usage of staff PPE, and extensive cleaning of exam room while observing appropriate contact time as indicated for disinfecting solutions.   COVID 19 screen:  No recent travel or  known exposure to COVID19 The patient denies respiratory symptoms of COVID 19 at this time. The importance of social distancing was discussed today.     ROS    Past Medical History:  Diagnosis Date  . Allergy   . GERD (gastroesophageal reflux disease)   . Hypertension     reports that she has never smoked. She has never used smokeless tobacco. She reports that she does not drink alcohol and does not use drugs.   Current Outpatient Medications:  .  amLODipine (NORVASC) 5 MG tablet, Take 1 tablet by mouth once daily, Disp: 90 tablet, Rfl: 0 .  atorvastatin (LIPITOR) 20 MG tablet, Take 1 tablet by mouth once daily, Disp: 90 tablet, Rfl: 0 .  Calcium Carbonate-Vitamin D (CALCIUM 600+D) 600-400 MG-UNIT per tablet, Take 1 tablet by mouth daily., Disp: , Rfl:  .  clobetasol ointment (TEMOVATE) 0.05 %, Apply topically as needed., Disp: 30 g, Rfl: 0 .  estradiol (ESTRACE) 0.5 MG tablet, Take 1 tablet by mouth every other day (Patient taking differently: Take one tablet then skip 2 days.  Patient trying to wean off medication), Disp: 45 tablet, Rfl: 3 .  halobetasol (ULTRAVATE) 0.05 % ointment, APPLY TO AFFECTED AREA TWICE A DAY, Disp: 15 g, Rfl: 0 .  metoprolol succinate (TOPROL-XL) 50 MG 24 hr tablet, Take 1 tablet by mouth once daily with or immediately following a meal, Disp: 90 tablet, Rfl: 0 .  Omega-3 Fatty Acids (FISH OIL) 1000 MG CPDR, Take 1 tablet by mouth daily., Disp: ,  Rfl:  .  omeprazole (PRILOSEC) 40 MG capsule, Take 40 mg by mouth daily., Disp: , Rfl:    Observations/Objective: Blood pressure 130/72, pulse 62, temperature 98.2 F (36.8 C), temperature source Temporal, height 5' 5.5" (1.664 m), weight 211 lb (95.7 kg), SpO2 96 %.  Physical Exam Constitutional:      General: She is not in acute distress.    Appearance: Normal appearance. She is well-developed. She is obese. She is not ill-appearing or toxic-appearing.  HENT:     Head: Normocephalic.     Right Ear: Hearing,  tympanic membrane, ear canal and external ear normal.     Left Ear: Hearing, tympanic membrane, ear canal and external ear normal.     Nose: Nose normal.  Eyes:     General: Lids are normal. Lids are everted, no foreign bodies appreciated.     Conjunctiva/sclera: Conjunctivae normal.     Pupils: Pupils are equal, round, and reactive to light.  Neck:     Thyroid: No thyroid mass or thyromegaly.     Vascular: No carotid bruit.     Trachea: Trachea normal.  Cardiovascular:     Rate and Rhythm: Normal rate and regular rhythm.     Heart sounds: Normal heart sounds, S1 normal and S2 normal. No murmur heard.  No gallop.   Pulmonary:     Effort: Pulmonary effort is normal. No respiratory distress.     Breath sounds: Normal breath sounds. No wheezing, rhonchi or rales.  Abdominal:     General: Bowel sounds are normal. There is no distension or abdominal bruit.     Palpations: Abdomen is soft. There is no fluid wave or mass.     Tenderness: There is no abdominal tenderness. There is no guarding or rebound.     Hernia: No hernia is present.  Musculoskeletal:     Cervical back: Normal range of motion and neck supple.  Lymphadenopathy:     Cervical: No cervical adenopathy.  Skin:    General: Skin is warm and dry.     Findings: No rash.  Neurological:     Mental Status: She is alert.     Cranial Nerves: No cranial nerve deficit.     Sensory: No sensory deficit.  Psychiatric:        Mood and Affect: Mood is not anxious or depressed.        Speech: Speech normal.        Behavior: Behavior normal. Behavior is cooperative.        Judgment: Judgment normal.      Assessment and Plan   The patient's preventative maintenance and recommended screening tests for an annual wellness exam were reviewed in full today. Brought up to date unless services declined.  Counselled on the importance of diet, exercise, and its role in overall health and mortality. The patient's FH and SH was reviewed,  including their home life, tobacco status, and drug and alcohol status.   Vaccines: Uptodate with PNA,shingles, given high dose flu today. Refused td. S/P COVID19 vaccine series. DEXA: stable, normal10/2017,On HRT. repeat in 5 years Mammogram: scheduled 03/2020, MGM, mother with breast cancer. DVE/PAP : TAH but has one ovary...  No symptoms, no family histroy of ovarian cancer. No need for DVE, pt agreeable Colon: 2019 polyps, family history colon cancer brother, repeat in 53 years, Dr. Benson [place]. Nonsmoker. Hep c: done  PURE HYPERCHOLESTEROLEMIA At goal on statin. No SE.  Obesity (BMI 30.0-34.9) Encouraged exercise, weight loss, healthy eating habits.  Prediabetes  Stable control.  Essential hypertension, benign Well controlled. Continue current medication.    Eliezer Lofts, MD

## 2020-03-01 NOTE — Patient Instructions (Addendum)
Work on [employer], regular exercise and weight loss.  Preventive Care 74 Years and Older, Female Preventive care refers to lifestyle choices and visits with your health care provider that can promote health and wellness. This includes:  A yearly physical exam. This is also called an annual well check.  Regular dental and eye exams.  Immunizations.  Screening for certain conditions.  Healthy lifestyle choices, such as diet and exercise. What can I expect for my preventive care visit? Physical exam Your health care provider will check:  Height and weight. These may be used to calculate body mass index (BMI), which is a measurement that tells if you are at a healthy weight.  Heart rate and blood pressure.  Your skin for abnormal spots. Counseling Your health care provider may ask you questions about:  Alcohol, tobacco, and drug use.  Emotional well-being.  Home and relationship well-being.  Sexual activity.  Eating habits.  History of falls.  Memory and ability to understand (cognition).  Work and work Statistician.  Pregnancy and menstrual history. What immunizations do I need?  Influenza (flu) vaccine  This is recommended every year. Tetanus, diphtheria, and pertussis (Tdap) vaccine  You may need a Td booster every 10 years. Varicella (chickenpox) vaccine  You may need this vaccine if you have not already been vaccinated. Zoster (shingles) vaccine  You may need this after age 74. Pneumococcal conjugate (PCV13) vaccine  One dose is recommended after age 74. Pneumococcal polysaccharide (PPSV23) vaccine  One dose is recommended after age 74. Measles, mumps, and rubella (MMR) vaccine  You may need at least one dose of MMR if you were born in 1957 or later. You may also need a second dose. Meningococcal conjugate (MenACWY) vaccine  You may need this if you have certain conditions. Hepatitis A vaccine  You may need this if you have certain  conditions or if you travel or work in places where you may be exposed to hepatitis A. Hepatitis B vaccine  You may need this if you have certain conditions or if you travel or work in places where you may be exposed to hepatitis B. Haemophilus influenzae type b (Hib) vaccine  You may need this if you have certain conditions. You may receive vaccines as individual doses or as more than one vaccine together in one shot (combination vaccines). Talk with your health care provider about the risks and benefits of combination vaccines. What tests do I need? Blood tests  Lipid and cholesterol levels. These may be checked every 5 years, or more frequently depending on your overall health.  Hepatitis C test.  Hepatitis B test. Screening  Lung cancer screening. You may have this screening every year starting at age 55 if you have a 30-pack-year history of smoking and currently smoke or have quit within the past 15 years.  Colorectal cancer screening. All adults should have this screening starting at age 74 and continuing until age 51. Your health care provider may recommend screening at age 40 if you are at increased risk. You will have tests every 1-10 years, depending on your results and the type of screening test.  Diabetes screening. This is done by checking your blood sugar (glucose) after you have not eaten for a while (fasting). You may have this done every 1-3 years.  Mammogram. This may be done every 1-2 years. Talk with your health care provider about how often you should have regular mammograms.  BRCA-related cancer screening. This may be done if you have  a family history of breast, ovarian, tubal, or peritoneal cancers. Other tests  Sexually transmitted disease (STD) testing.  Bone density scan. This is done to screen for osteoporosis. You may have this done starting at age 74. Follow these instructions at home: Eating and drinking  Eat a diet that includes fresh fruits and  vegetables, whole grains, lean protein, and low-fat dairy products. Limit your intake of foods with high amounts of sugar, saturated fats, and salt.  Take vitamin and mineral supplements as recommended by your health care provider.  Do not drink alcohol if your health care provider tells you not to drink.  If you drink alcohol: ? Limit how much you have to 0-1 drink a day. ? Be aware of how much alcohol is in your drink. In the [place] one drink equals one 12 oz bottle of beer (355 mL), one 5 oz glass of wine (148 mL), or one 1 oz glass of hard liquor (44 mL). Lifestyle  Take daily care of your teeth and gums.  Stay active. Exercise for at least 30 minutes on 5 or more days each week.  Do not use any products that contain nicotine or tobacco, such as cigarettes, e-cigarettes, and chewing tobacco. If you need help quitting, ask your health care provider.  If you are sexually active, practice safe sex. Use a condom or other form of protection in order to prevent STIs (sexually transmitted infections).  Talk with your health care provider about taking a low-dose aspirin or statin. What's next?  Go to your health care provider once a year for a well check visit.  Ask your health care provider how often you should have your eyes and teeth checked.  Stay up to date on all vaccines. This information is not intended to replace advice given to you by your health care provider. Make sure you discuss any questions you have with your health care provider. Document Revised: 05/27/2018 Document Reviewed: 05/27/2018 Elsevier Patient Education  2020 [employer].

## 2020-03-01 NOTE — Assessment & Plan Note (Signed)
Encouraged exercise, weight loss, healthy eating habits. ? ?

## 2020-03-01 NOTE — Assessment & Plan Note (Signed)
At goal on statin. No SE.

## 2020-03-01 NOTE — Assessment & Plan Note (Signed)
Stable control. 

## 2020-03-01 NOTE — Assessment & Plan Note (Signed)
Well controlled. Continue current medication.  

## 2020-03-29 ENCOUNTER — Other Ambulatory Visit: Payer: Self-pay

## 2020-03-29 ENCOUNTER — Ambulatory Visit
Admission: RE | Admit: 2020-03-29 | Discharge: 2020-03-29 | Disposition: A | Discharge status: Home / Self Care | Payer: PPO | Source: Ambulatory Visit | Attending: Family Medicine | Admitting: Family Medicine

## 2020-03-29 DIAGNOSIS — Z1231 Encounter for screening mammogram for malignant neoplasm of breast: Secondary | ICD-10-CM | POA: Diagnosis not present

## 2020-04-02 ENCOUNTER — Telehealth: Payer: Self-pay

## 2020-04-02 MED ORDER — AMLODIPINE BESYLATE 5 MG PO TABS
5.0000 mg | ORAL_TABLET | Freq: Every day | ORAL | 0 refills | Status: DC
Start: 2020-04-02 — End: 2020-08-23

## 2020-04-02 MED ORDER — METOPROLOL SUCCINATE ER 50 MG PO TB24: ORAL_TABLET | ORAL | 0 refills | Status: DC

## 2020-04-02 MED ORDER — ESTRADIOL 0.5 MG PO TABS
0.5000 mg | ORAL_TABLET | ORAL | 3 refills | Status: DC
Start: 2020-04-02 — End: 2022-04-11

## 2020-04-02 MED ORDER — ATORVASTATIN CALCIUM 20 MG PO TABS
20.0000 mg | ORAL_TABLET | Freq: Every day | ORAL | 0 refills | Status: DC
Start: 2020-04-02 — End: 2020-08-23

## 2020-04-02 NOTE — Telephone Encounter (Signed)
Fax from [facility] requesting refill of atorvastatin, estradiol, amlodipine, and metoprolol.  Last OV 03/01/20  Next OV 04/27/20  Last fill7/20/21 # 90, no RF  Sent in RF

## 2020-04-27 ENCOUNTER — Other Ambulatory Visit: Payer: Self-pay | Admitting: Family Medicine

## 2020-04-27 ENCOUNTER — Telehealth: Payer: PPO

## 2020-05-14 ENCOUNTER — Ambulatory Visit: Payer: PPO | Admitting: Orthopaedic Surgery

## 2020-05-14 ENCOUNTER — Ambulatory Visit (INDEPENDENT_AMBULATORY_CARE_PROVIDER_SITE_OTHER): Payer: PPO

## 2020-05-14 DIAGNOSIS — M25561 Pain in right knee: Secondary | ICD-10-CM

## 2020-05-14 MED ORDER — LIDOCAINE HCL 1 % IJ SOLN
3.0000 mL | INTRAMUSCULAR | Status: AC | PRN
  Administered 2020-05-14: 3 mL

## 2020-05-14 MED ORDER — METHYLPREDNISOLONE ACETATE 40 MG/ML IJ SUSP
40.0000 mg | INTRAMUSCULAR | Status: AC | PRN
  Administered 2020-05-14: 40 mg via INTRA_ARTICULAR

## 2020-05-14 NOTE — Progress Notes (Signed)
Office Visit Note   Patient: Claire Lewis           Date of Birth: [birth year]           MRN: [id number] Visit Date: 05/14/2020              Requested by: Jinny Sanders, MD [place],  [place] [address] PCP: Jinny Sanders, MD   Assessment & Plan: Visit Diagnoses:  1. Acute pain of right knee     Plan: It was definitely worth aspirating her right knee today and placing a steroid injection in that knee.  I recommended she ambulate with a cane in her opposite hand as she rehabilitates and rest her right knee.  I would like to reevaluate her knee in 2 weeks from now to then determine the best course of action of whether this would be hyaluronic acid versus other modalities.  We may even consider a MRI of the knee since she has not had any issues in that knee before.   Follow-Up Instructions: Return in about 2 weeks (around 05/28/2020).   Orders:  Orders Placed This Encounter  Procedures  . Large Joint Inj  . XR Knee 1-2 Views Right   No orders of the defined types were placed in this encounter.     Procedures: Large Joint Inj: R knee on 05/14/2020 2:44 PM Indications: diagnostic evaluation and pain Details: 22 G 1.5 in needle, superolateral approach  Arthrogram: No  Medications: 3 mL lidocaine 1 %; 40 mg methylPREDNISolone acetate 40 MG/ML Outcome: tolerated well, no immediate complications Procedure, treatment alternatives, risks and benefits explained, specific risks discussed. Consent was given by the patient. Immediately prior to procedure a time out was called to verify the correct patient, procedure, equipment, support staff and site/side marked as required. Patient was prepped and draped in the usual sterile fashion.       Clinical Data: No additional findings.   Subjective: Chief Complaint  Patient presents with  . Right Knee - Pain  The patient comes in today with a weeks worth of right knee pain and some swelling.  She says it hurts  really the back of her knee.  She is tried some Voltaren gel not to help.  She is using knee sleeve and some elevation.  She is walking with a limp.  She does not use an assistive device.  She has not had any issues with her right knee before or her left knee.  She denies any injuries.  She is never had any surgery on her knees.  HPI  Review of Systems There is currently no listed headache, chest pain, shortness of breath, fever, chills, nausea, vomiting  Objective: Vital Signs: There were no vitals taken for this visit.  Physical Exam She is alert and orient x3 and in no acute distress Ortho Exam Examination of her right knee comparing the right and left knees does show a mild to moderate effusion.  I was able to aspirate 20 cc of fluid from the knee and I think there is probably little more.  This was clear yellow fluid consistent with arthritis.  She has pain in the back of her knee past 90 degrees of flexion.  The knee is ligamentously stable. Specialty Comments:  No specialty comments available.  Imaging: XR Knee 1-2 Views Right  Result Date: 05/14/2020 Standing views that show both knees in the AP view show calcifications around both medial and lateral meniscus of both  knees.  There is more medial joint space narrowing on the left knee than the right knee but the right knee does have mild MR arthritic changes.  There is no acute findings.    PMFS History: Patient Active Problem List   Diagnosis Date Noted  . Prediabetes 02/08/2019  . Counseling regarding end of life decision making 11/07/2014  . Obesity (BMI 30.0-34.9) 11/07/2014  . Rotator cuff impingement syndrome 01/08/2011  . PURE HYPERCHOLESTEROLEMIA 05/24/2009  . Essential hypertension, benign 01/03/2009  . ALLERGIC RHINITIS 01/03/2009  . GERD (gastroesophageal reflux disease) 01/03/2009  . OTHER LICHEN NOT ELSEWHERE CLASSIFIED 01/03/2009  . COLONIC POLYPS, HX OF 01/03/2009   Past Medical History:  Diagnosis Date    . Allergy   . GERD (gastroesophageal reflux disease)   . Hypertension     Family History  Problem Relation Age of Onset  . Heart attack Father 38  . Coronary artery disease Father   . Cancer Mother        breast  . Dementia Mother   . Breast cancer Mother   . Cancer Brother        colon  . Cancer Paternal Grandmother        lung  . Breast cancer Paternal Grandmother   . Meniere's disease Daughter     Past Surgical History:  Procedure Laterality Date  . CATARACT EXTRACTION, BILATERAL  04/2019  . PARTIAL HYSTERECTOMY  1983   one ovary remains for endometriosis   Social History   Occupational History  . 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 Smoking status: Never Smoker  . Smokeless tobacco: Never Used  Vaping Use  . Vaping Use: Never used  Substance and Sexual Activity  . Alcohol use: No  . Drug use: No  . Sexual activity: Never

## 2020-05-29 ENCOUNTER — Encounter: Payer: Self-pay | Admitting: Orthopaedic Surgery

## 2020-05-29 ENCOUNTER — Ambulatory Visit: Payer: PPO | Admitting: Orthopaedic Surgery

## 2020-05-29 DIAGNOSIS — M25561 Pain in right knee: Secondary | ICD-10-CM

## 2020-05-29 NOTE — Progress Notes (Signed)
The patient is someone who is following up 2 weeks after place a steroid injection in her right knee to treat acute knee pain.  She is 74 years old and active.  She has had an acute flareup of knee pain with no obvious injury.  She states with the steroid injection she is doing significantly better overall.  She said it was very helpful.  She never ended up using a cane.  She says that she has her knee straight sitting in recliner that occasionally her knees get stiff and she has to work it out.  She denies any locking catching.  She denies any clicking or mechanical symptoms.  She is slow to get up from a chair but examination of her right knee shows no effusion and no instability on exam.  She has good range of motion of her right knee with minimal pain.  At this point since she is doing so well follow-up can be as needed.  I still advocate quad strengthening exercises for both her knees to get them stronger so she can ambulate better.  If she ends up having a recurrence of her pain I recommended at least a repeat steroid injection sometime in the late winter and would even recommend outpatient physical therapy to strengthen her knees.  All questions and concerns were answered and addressed.  Follow-up is as needed.

## 2020-06-04 ENCOUNTER — Other Ambulatory Visit: Payer: Self-pay

## 2020-06-04 ENCOUNTER — Ambulatory Visit (INDEPENDENT_AMBULATORY_CARE_PROVIDER_SITE_OTHER): Payer: PPO | Admitting: Primary Care

## 2020-06-04 VITALS — BP 120/70 | HR 61 | Temp 97.6°F | Ht 65.5 in | Wt 215.0 lb

## 2020-06-04 DIAGNOSIS — R300 Dysuria: Secondary | ICD-10-CM | POA: Diagnosis not present

## 2020-06-04 LAB — POC URINALSYSI DIPSTICK (AUTOMATED)
Bilirubin, UA: NEGATIVE
Blood, UA: NEGATIVE
Glucose, UA: NEGATIVE
Leukocytes, UA: NEGATIVE
Nitrite, UA: NEGATIVE
Protein, UA: NEGATIVE
Spec Grav, UA: >=1.03 — AB (ref 1.010–1.025)
Urobilinogen, UA: 0.2 E.U./dL
pH, UA: 5.5 (ref 5.0–8.0)

## 2020-06-04 NOTE — Patient Instructions (Signed)
We will be in touch once we receive your urine culture results.  Ensure you are consuming 64 ounces of water daily.  It was a pleasure meeting you!

## 2020-06-04 NOTE — Progress Notes (Signed)
Subjective:    Patient ID: Claire Lewis, female    DOB: November 24, 1945, 74 y.o.   MRN: [id number]  HPI  This visit occurred during the SARS-CoV-2 public health emergency.  Safety protocols were in place, including screening questions prior to the visit, additional usage of staff PPE, and extensive cleaning of exam room while observing appropriate contact time as indicated for disinfecting solutions.   Claire Lewis is a 74 year old female patient of Dr. Diona Browner with a history of hypertension, prediabetes, acute cystitis who presents today with a chief complaint of dysuria.  Symptoms began about one week ago with dysuria during and after urination. She has noticed urinary frequency at night and urgency during the day. She denies vaginal discharge, increased vaginal itching, hematuria, abdominal pain. Today her symptoms have improved. She's taken some Ibuprofen.   Review of Systems  Constitutional: Negative for fever.  Gastrointestinal: Negative for abdominal pain.  Genitourinary: Positive for dysuria, frequency and urgency. Negative for hematuria, pelvic pain, vaginal bleeding and vaginal discharge.       Past Medical History:  Diagnosis Date  . Allergy   . GERD (gastroesophageal reflux disease)   . Hypertension      Social History   Socioeconomic History  . Marital status: Married    Spouse name: Not on file  . Number of children: Not on file  . Years of education: Not on file  . Highest education level: Not on file  Occupational History  . 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 Smoking status: Never Smoker  . Smokeless tobacco: Never Used  Vaping Use  . Vaping Use: Never used  Substance and Sexual Activity  . Alcohol use: No  . Drug use: No  . Sexual activity: Never  Other Topics Concern  . Not on file  Social History Narrative   Regular exercise---walks 3-4 times a week   Diet: fruits and veggies, no fried foods, on weight  watchers                  Social Determinants of Health   Financial Resource Strain: Low Risk   . Difficulty of Paying Living Expenses: Not hard at all  Food Insecurity: No Food Insecurity  . Worried About Charity fundraiser in the Last Year: Never true  . Ran Out of Food in the Last Year: Never true  Transportation Needs: No Transportation Needs  . Lack of Transportation (Medical): No  . Lack of Transportation (Non-Medical): No  Physical Activity: Inactive  . Days of Exercise per Week: 0 days  . Minutes of Exercise per Session: 0 min  Stress: No Stress Concern Present  . Feeling of Stress : Not at all  Social Connections: Not on file  Intimate Partner Violence: Not At Risk  . Fear of Current or Ex-Partner: No  . Emotionally Abused: No  . Physically Abused: No  . Sexually Abused: No    Past Surgical History:  Procedure Laterality Date  . CATARACT EXTRACTION, BILATERAL  04/2019  . PARTIAL HYSTERECTOMY  1983   one ovary remains for endometriosis    Family History  Problem Relation Age of Onset  . Heart attack Father 61  . Coronary artery disease Father   . Cancer Mother        breast  . Dementia Mother   . Breast cancer Mother   . Cancer Brother        colon  .  Cancer Paternal Grandmother        lung  . Breast cancer Paternal Grandmother   . Meniere's disease Daughter     No Known Allergies  Current Outpatient Medications on File Prior to Visit  Medication Sig Dispense Refill  . amLODipine (NORVASC) 5 MG tablet Take 1 tablet (5 mg total) by mouth daily. 90 tablet 0  . atorvastatin (LIPITOR) 20 MG tablet Take 1 tablet (20 mg total) by mouth daily. 90 tablet 0  . Calcium Carbonate-Vitamin D 600-400 MG-UNIT tablet Take 1 tablet by mouth daily.    . clobetasol ointment (TEMOVATE) 0.05 % Apply topically as needed. 30 g 0  . estradiol (ESTRACE) 0.5 MG tablet Take 1 tablet (0.5 mg total) by mouth every other day. 45 tablet 3  . halobetasol (ULTRAVATE) 0.05 %  ointment APPLY TO AFFECTED AREA TWICE A DAY 15 g 0  . metoprolol succinate (TOPROL-XL) 50 MG 24 hr tablet Take 1 tablet by mouth once daily with or immediately following a meal 90 tablet 0  . Omega-3 Fatty Acids (FISH OIL) 1000 MG CPDR Take 1 tablet by mouth daily.    Marland Kitchen omeprazole (PRILOSEC) 40 MG capsule Take 40 mg by mouth daily.     No current facility-administered medications on file prior to visit.    BP 120/70   Pulse 61   Temp 97.6 F (36.4 C) (Temporal)   Ht 5' 5.5" (1.664 m)   Wt 215 lb (97.5 kg)   SpO2 98%   BMI 35.23 kg/m    Objective:   Physical Exam Constitutional:      Appearance: She is not ill-appearing.  Cardiovascular:     Rate and Rhythm: Normal rate and regular rhythm.  Abdominal:     General: Abdomen is flat.     Palpations: Abdomen is soft.     Tenderness: There is abdominal tenderness in the suprapubic area. There is no right CVA tenderness or left CVA tenderness.  Neurological:     Mental Status: She is alert.            Assessment & Plan:

## 2020-06-04 NOTE — Assessment & Plan Note (Signed)
Acute for the last week, improved today. UA today negative. Culture sent.  Discussed other causes for perineal burning including vaginal atrophy. Discussed vaginal moisturizers. Await culture results.

## 2020-06-05 LAB — URINE CULTURE
MICRO NUMBER:: 11338195
SPECIMEN QUALITY:: ADEQUATE

## 2020-06-15 ENCOUNTER — Ambulatory Visit
Admission: EM | Admit: 2020-06-15 | Discharge: 2020-06-15 | Disposition: A | Discharge status: Home / Self Care | Payer: PPO | Attending: Internal Medicine | Admitting: Internal Medicine

## 2020-06-15 ENCOUNTER — Other Ambulatory Visit: Payer: Self-pay

## 2020-06-15 DIAGNOSIS — N3000 Acute cystitis without hematuria: Secondary | ICD-10-CM

## 2020-06-15 LAB — POCT URINALYSIS DIP (MANUAL ENTRY)
Bilirubin, UA: NEGATIVE
Glucose, UA: NEGATIVE mg/dL
Ketones, POC UA: NEGATIVE mg/dL
Nitrite, UA: POSITIVE — AB
Spec Grav, UA: <=1.005 — AB (ref 1.010–1.025)
Urobilinogen, UA: 0.2 E.U./dL
pH, UA: 5 (ref 5.0–8.0)

## 2020-06-15 MED ORDER — PHENAZOPYRIDINE HCL 200 MG PO TABS: 200.0000 mg | ORAL_TABLET | Freq: Three times a day (TID) | ORAL | 0 refills | Status: DC

## 2020-06-15 MED ORDER — NITROFURANTOIN MONOHYD MACRO 100 MG PO CAPS: 100.0000 mg | ORAL_CAPSULE | Freq: Two times a day (BID) | ORAL | 0 refills | Status: DC

## 2020-06-15 NOTE — ED Triage Notes (Signed)
Pt reports frequency and dysuria x 1 week.

## 2020-06-15 NOTE — ED Provider Notes (Signed)
Roderic Palau    CSN: [id number] Arrival date & time: 06/15/20  1415      History   Chief Complaint Chief Complaint  Patient presents with  . Urinary Frequency    HPI Claire Lewis is a 74 y.o. female who presents with dysuria, frequency x 5 days. Had mild chills yesterday and mild ache on the L back. No N/V/D. Had negative urine culture 12/20 when she was having a flair of her Merilyn Baba which is better now.     Past Medical History:  Diagnosis Date  . Allergy   . GERD (gastroesophageal reflux disease)   . Hypertension     Patient Active Problem List   Diagnosis Date Noted  . Dysuria 06/04/2020  . Prediabetes 02/08/2019  . Counseling regarding end of life decision making 11/07/2014  . Obesity (BMI 30.0-34.9) 11/07/2014  . Rotator cuff impingement syndrome 01/08/2011  . PURE HYPERCHOLESTEROLEMIA 05/24/2009  . Essential hypertension, benign 01/03/2009  . ALLERGIC RHINITIS 01/03/2009  . GERD (gastroesophageal reflux disease) 01/03/2009  . OTHER LICHEN NOT ELSEWHERE CLASSIFIED 01/03/2009  . COLONIC POLYPS, HX OF 01/03/2009    Past Surgical History:  Procedure Laterality Date  . CATARACT EXTRACTION, BILATERAL  04/2019  . PARTIAL HYSTERECTOMY  1983   one ovary remains for endometriosis    OB History   No obstetric history on file.      Home Medications    Prior to Admission medications   Medication Sig Start Date End Date Taking? Authorizing Provider  amLODipine (NORVASC) 5 MG tablet Take 1 tablet (5 mg total) by mouth daily. 04/02/20  Yes Bedsole, Amy E, MD  atorvastatin (LIPITOR) 20 MG tablet Take 1 tablet (20 mg total) by mouth daily. 04/02/20  Yes Bedsole, Amy E, MD  Calcium Carbonate-Vitamin D 600-400 MG-UNIT tablet Take 1 tablet by mouth daily.   Yes [provider]  estradiol (ESTRACE) 0.5 MG tablet Take 1 tablet (0.5 mg total) by mouth every other day. 04/02/20  Yes Bedsole, Amy E, MD  halobetasol (ULTRAVATE) 0.05 % ointment APPLY TO  AFFECTED AREA TWICE A DAY 04/27/20  Yes Bedsole, Amy E, MD  metoprolol succinate (TOPROL-XL) 50 MG 24 hr tablet Take 1 tablet by mouth once daily with or immediately following a meal 04/02/20  Yes Bedsole, Amy E, MD  nitrofurantoin, macrocrystal-monohydrate, (MACROBID) 100 MG capsule Take 1 capsule (100 mg total) by mouth 2 (two) times daily. 06/15/20  Yes Rodriguez-Southworth, Sunday Spillers, PA-C  Omega-3 Fatty Acids (FISH OIL) 1000 MG CPDR Take 1 tablet by mouth daily.   Yes [provider]  omeprazole (PRILOSEC) 40 MG capsule Take 40 mg by mouth daily.   Yes [provider]  phenazopyridine (PYRIDIUM) 200 MG tablet Take 1 tablet (200 mg total) by mouth 3 (three) times daily. 06/15/20  Yes Rodriguez-Southworth, Sunday Spillers, PA-C    Family History Family History  Problem Relation Age of Onset  . Heart attack Father 53  . Coronary artery disease Father   . Cancer Mother        breast  . Dementia Mother   . Breast cancer Mother   . Cancer Brother        colon  . Cancer Paternal Grandmother        lung  . Breast cancer Paternal Grandmother   . Meniere's disease Daughter     Social History Social History   Tobacco Use  . Smoking status: Never Smoker  . Smokeless tobacco: Never Used  Vaping Use  . Vaping  Use: Never used  Substance Use Topics  . Alcohol use: No  . Drug use: No     Allergies   Patient has no known allergies.   Review of Systems Review of Systems  Constitutional: Positive for chills. Negative for fever.  Gastrointestinal: Negative for nausea and vomiting.       + suprapubic pain  Genitourinary: Positive for dysuria, frequency and urgency. Negative for flank pain, pelvic pain and vaginal discharge.  Musculoskeletal: Positive for back pain. Negative for gait problem.  Skin: Negative for rash.     Physical Exam Triage Vital Signs ED Triage Vitals  Enc Vitals Group     BP 06/15/20 1538 (!) 170/80     Pulse Rate 06/15/20 1538 80     Resp 06/15/20  1538 18     Temp 06/15/20 1538 (!) 97.5 F (36.4 C)     Temp Source 06/15/20 1538 Temporal     SpO2 06/15/20 1538 95 %     Weight --      Height --      Head Circumference --      Peak Flow --      Pain Score 06/15/20 1521 2     Pain Loc --      Pain Edu? --      Excl. in GC? --    No data found.  Updated Vital Signs BP (S) (!) 164/85 (BP Location: Left Arm) Comment: provider aware.  Pulse 80   Temp (!) 97.5 F (36.4 C) (Temporal)   Resp 18   SpO2 95%   Visual Acuity Right Eye Distance:   Left Eye Distance:   Bilateral Distance:    Right Eye Near:   Left Eye Near:    Bilateral Near:     Physical Exam Physical Exam Vitals and nursing note reviewed.  Constitutional:      General: She is not in acute distress.    Appearance: She is not toxic-appearing.  HENT:     Head: Normocephalic.     Right Ear: External ear normal.     Left Ear: External ear normal.  Eyes:     General: No scleral icterus.    Conjunctiva/sclera: Conjunctivae normal.  Pulmonary:     Effort: Pulmonary effort is normal.  Abdominal:     General: Bowel sounds are normal.     Palpations: Abdomen is soft. There is no mass.     Tenderness: There is no guarding or rebound.     Comments: - CVA tenderness   Musculoskeletal:        General: Normal range of motion.     Cervical back: Neck supple.     Comments:  Has mild musculoskeletal tenderness on L mid lumbar region Skin:    General: Skin is warm and dry.     Findings: No rash.  Neurological:     Mental Status: She is alert and oriented to person, place, and time.     Gait: Gait normal.  Psychiatric:        Mood and Affect: Mood normal.        Behavior: Behavior normal.        Thought Content: Thought content normal.        Judgment: Judgment normal.     UC Treatments / Results  Labs (all labs ordered are listed, but only abnormal results are displayed) Labs Reviewed  POCT URINALYSIS DIP (MANUAL ENTRY) - Abnormal; Notable for the  following components:  Result Value   Clarity, UA cloudy (*)    Spec Grav, UA <=1.005 (*)    Blood, UA large (*)    Protein Ur, POC trace (*)    Nitrite, UA Positive (*)    Leukocytes, UA Small (1+) (*)    All other components within normal limits  URINE CULTURE    EKG   Radiology No results found.  Procedures Procedures (including critical care time)  Medications Ordered in UC Medications - No data to display  Initial Impression / Assessment and Plan / UC Course  I have reviewed the triage vital signs and the nursing notes. Acute UTI. I sent her urine for a culture and placed her on macrobid and pyridium. I checked her last labs and has had normal creatinine all year.  Final Clinical Impressions(s) / UC Diagnoses   Final diagnoses:  Acute cystitis without hematuria     Discharge Instructions     We are sending your urine for a culture and if we need to change your medication we will call you    ED Prescriptions    Medication Sig Dispense Auth. Provider   nitrofurantoin, macrocrystal-monohydrate, (MACROBID) 100 MG capsule Take 1 capsule (100 mg total) by mouth 2 (two) times daily. 10 capsule Rodriguez-Southworth, Sunday Spillers, PA-C   phenazopyridine (PYRIDIUM) 200 MG tablet Take 1 tablet (200 mg total) by mouth 3 (three) times daily. 6 tablet Rodriguez-Southworth, Sunday Spillers, PA-C     PDMP not reviewed this encounter.   Shelby Mattocks, PA-C 06/15/20 1550

## 2020-06-15 NOTE — Discharge Instructions (Signed)
We are sending your urine for a culture and if we need to change your medication we will call you

## 2020-06-18 LAB — URINE CULTURE
Culture: >=100000 — AB
Special Requests: NORMAL

## 2020-07-17 ENCOUNTER — Telehealth: Payer: Self-pay

## 2020-07-17 NOTE — Chronic Care Management (AMB) (Addendum)
° ° °  Chronic Care Management Pharmacy Assistant   Name: Claire Lewis  MRN: [id number] DOB: 07/30/1945  Reason for Encounter: Schedule CCM appointment  Patient Questions:  1.  Have you seen any other providers since your last visit? Yes 06/15/20- ED visit- Acute cystitis 06/04/20- Alma Friendly, NP- PCP 05/29/20- Dr. [name]- Orthopedics 05/14/20- Dr. [name]- Orthopedics 03/01/20- Dr. Eliezer Lofts - PCP   2.  Any changes in your medicines or health? No   PCP : Jinny Sanders, MD  Allergies:  No Known Allergies  Medications: Outpatient Encounter Medications as of 07/17/2020  Medication Sig   amLODipine (NORVASC) 5 MG tablet Take 1 tablet (5 mg total) by mouth daily.   atorvastatin (LIPITOR) 20 MG tablet Take 1 tablet (20 mg total) by mouth daily.   Calcium Carbonate-Vitamin D 600-400 MG-UNIT tablet Take 1 tablet by mouth daily.   estradiol (ESTRACE) 0.5 MG tablet Take 1 tablet (0.5 mg total) by mouth every other day.   halobetasol (ULTRAVATE) 0.05 % ointment APPLY TO AFFECTED AREA TWICE A DAY   metoprolol succinate (TOPROL-XL) 50 MG 24 hr tablet Take 1 tablet by mouth once daily with or immediately following a meal   nitrofurantoin, macrocrystal-monohydrate, (MACROBID) 100 MG capsule Take 1 capsule (100 mg total) by mouth 2 (two) times daily.   Omega-3 Fatty Acids (FISH OIL) 1000 MG CPDR Take 1 tablet by mouth daily.   omeprazole (PRILOSEC) 40 MG capsule Take 40 mg by mouth daily.   phenazopyridine (PYRIDIUM) 200 MG tablet Take 1 tablet (200 mg total) by mouth 3 (three) times daily.   No facility-administered encounter medications on file as of 07/17/2020.    Current Diagnosis: Patient Active Problem List   Diagnosis Date Noted   Dysuria 06/04/2020   Prediabetes 02/08/2019   Counseling regarding end of life decision making 11/07/2014   Obesity (BMI 30.0-34.9) 11/07/2014   Rotator cuff impingement syndrome 01/08/2011   PURE HYPERCHOLESTEROLEMIA  05/24/2009   Essential hypertension, benign 01/03/2009   ALLERGIC RHINITIS 01/03/2009   GERD (gastroesophageal reflux disease) 84/66/5993   OTHER LICHEN NOT ELSEWHERE CLASSIFIED 01/03/2009   COLONIC POLYPS, HX OF 01/03/2009   Contacted Ms. Widmayer per [name], Pharm. D, to schedule CCM follow up appointment. Last visit with Sharyn Lull was 10/27/19.  Follow up appointment was scheduled for 09/04/20 at 9:30 am.   Are you having any problems with your medications? Denies any complications or problems with medications  What concerns would like to discuss with the pharmacist? Not at this time.   Patient reminded to have all medications, supplements and any blood pressure readings available for review with [name], Pharm. D, at their telephone visit on 09/04/20 at 9:30 AM.     Follow-Up:  Pharmacist Review and Scheduled Follow-Up With Clinical Pharmacist   [name], CPP notified  Margaretmary Dys, [place] [phone]  I have reviewed the care management and care coordination activities outlined in this encounter and I am certifying that I agree with the content of this note. No further action required.  [name], PharmD Clinical Pharmacist [facility] at [facility] [phone]

## 2020-08-23 ENCOUNTER — Other Ambulatory Visit: Payer: Self-pay | Admitting: Family Medicine

## 2020-08-23 ENCOUNTER — Other Ambulatory Visit: Payer: Self-pay | Admitting: *Deleted

## 2020-08-23 MED ORDER — AMLODIPINE BESYLATE 5 MG PO TABS: 5.0000 mg | ORAL_TABLET | Freq: Every day | ORAL | 1 refills | Status: DC

## 2020-08-23 MED ORDER — ATORVASTATIN CALCIUM 20 MG PO TABS: 20.0000 mg | ORAL_TABLET | Freq: Every day | ORAL | 1 refills | Status: DC

## 2020-08-23 MED ORDER — METOPROLOL SUCCINATE ER 50 MG PO TB24: ORAL_TABLET | ORAL | 1 refills | Status: DC

## 2020-08-23 NOTE — Telephone Encounter (Signed)
Last office visit 06/04/2020 for dysuria with Gentry Fitz.  Last refilled 04/27/2020 for 15 g with no refills.  No future appointments with PCP.

## 2020-09-04 ENCOUNTER — Ambulatory Visit (INDEPENDENT_AMBULATORY_CARE_PROVIDER_SITE_OTHER): Payer: PPO

## 2020-09-04 ENCOUNTER — Other Ambulatory Visit: Payer: Self-pay

## 2020-09-04 DIAGNOSIS — E7800 Pure hypercholesterolemia, unspecified: Secondary | ICD-10-CM | POA: Diagnosis not present

## 2020-09-04 DIAGNOSIS — I10 Essential (primary) hypertension: Secondary | ICD-10-CM | POA: Diagnosis not present

## 2020-09-04 NOTE — Patient Instructions (Signed)
Dear Claire Lewis,  Below is a summary of the goals we discussed during our follow up appointment on September 04, 2020. Please contact me anytime with questions or concerns.   Visit Information   Patient Care Plan: CCM Pharmacy Care Plan    Problem Identified: [facility] AMB "PATIENT-SPECIFIC PROBLEM"     Long-Range Goal: Disease Management   Start Date: 09/04/2020  Priority: High  Note:    Current Barriers:  . Elevated blood pressure on home monitoring  Pharmacist Clinical Goal(s):  Marland Kitchen Patient will adhere to plan to improve blood pressure with lifestyle changes with goal of BP < 140/90  Interventions: . 1:1 collaboration with Jinny Sanders, MD regarding development and update of comprehensive plan of care as evidenced by provider attestation and co-signature . Inter-disciplinary care team collaboration (see longitudinal plan of care) . Comprehensive medication review performed; medication list updated in electronic medical record  Hypertension (BP goal <140/90) -Uncontrolled -Current treatment: . Amlodipine 5 mg - 1 tablet daily . Metoprolol succinate 50 mg - 1 tablet daily  -Medications previously tried: none  -Recently purchased a new home BP monitor, checking daily with automatic monitor, upper arm  -Current home readings: 164/81 (this morning), 176/82, 179/80, 174/68, 162/73 -Current dietary habits: loves salt  -Current exercise habits: exercise has decreased a lot with right knee pain -Taking ibuprofen 200 mg about 1-2 times a day for past three months. Last week took Zyrtec for allergies, denies any pseudoephedrine. Reports she has gained some weight. Does not watch salt intake. Denies any swelling. -Denies hypotensive/hypertensive symptoms -Educated on BP goals and benefits of medications for prevention of heart attack, stroke and kidney damage; Proper BP monitoring technique; -Counseled to monitor BP at home daily, document, and provide log at future  appointments -Discussed medication change versus some lifestyle changes. Will trial lifestyle for 2 weeks.  -Recommended to continue current medication; Limit salt, switch ibuprofen to Tylenol, try increasing exercise as able. Continue to check BP daily. Instructed her to call if any alarm symptoms or if BP remains elevated. CMA call in 2 weeks for BP log to assess lifestyle changes (call around 8:30 AM).  Hyperlipidemia: (LDL goal < 100) -Controlled -Current treatment: . Atorvastatin 20 mg - 1 tablet daily -Medications previously tried: none -Recommended to continue current medication Assessed refill history - > 30 day gap in refills. Pt denies adherence gaps. Reports auto refill results in early fills and excess medications.  Patient Goals/Self-Care Activities . Patient will:  - engage in dietary modifications by limiting salt - try a salt substitute, limit processed foods and eating out  -switch ibuprofen to Tylenol, let us know if this does not control your pain  Follow Up Plan: The care management team will reach out to the patient again over the next 14 days.       The patient verbalized understanding of instructions, educational materials, and care plan provided today and agreed to receive a mailed copy of patient instructions, educational materials, and care plan.  The pharmacy team will reach out to the patient again over the next 14 days.   Claire Lewis, PharmD Clinical Pharmacist [facility] at [facility] [phone]   [email or web address]">  DASH Eating Plan DASH stands for Dietary Approaches to Stop Hypertension. The DASH eating plan is a healthy eating plan that has been shown to:  Reduce high blood pressure (hypertension).  Reduce your risk for type 2 diabetes, heart disease, and stroke.  Help with weight loss. What are tips  for following this plan? Reading food labels  Check food labels for the amount  of salt (sodium) per serving. Choose foods with less than 5 percent of the Daily Value of sodium. Generally, foods with less than 300 milligrams (mg) of sodium per serving fit into this eating plan.  To find whole grains, look for the word "whole" as the first word in the ingredient list. Shopping  Buy products labeled as "low-sodium" or "no salt added."  Buy fresh foods. Avoid canned foods and pre-made or frozen meals. Cooking  Avoid adding salt when cooking. Use salt-free seasonings or herbs instead of table salt or sea salt. Check with your health care provider or pharmacist before using salt substitutes.  Do not fry foods. Cook foods using healthy methods such as baking, boiling, grilling, roasting, and broiling instead.  Cook with heart-healthy oils, such as olive, canola, avocado, soybean, or sunflower oil. Meal planning  Eat a balanced diet that includes: ? 4 or more servings of fruits and 4 or more servings of vegetables each day. Try to fill one-half of your plate with fruits and vegetables. ? 6-8 servings of whole grains each day. ? Less than 6 oz (170 g) of lean meat, poultry, or fish each day. A 3-oz (85-g) serving of meat is about the same size as a deck of cards. One egg equals 1 oz (28 g). ? 2-3 servings of low-fat dairy each day. One serving is 1 cup (237 mL). ? 1 serving of nuts, seeds, or beans 5 times each week. ? 2-3 servings of heart-healthy fats. Healthy fats called omega-3 fatty acids are found in foods such as walnuts, flaxseeds, fortified milks, and eggs. These fats are also found in cold-water fish, such as sardines, salmon, and mackerel.  Limit how much you eat of: ? Canned or prepackaged foods. ? Food that is high in trans fat, such as some fried foods. ? Food that is high in saturated fat, such as fatty meat. ? Desserts and other sweets, sugary drinks, and other foods with added sugar. ? Full-fat dairy products.  Do not salt foods before eating.  Do not  eat more than 4 egg yolks a week.  Try to eat at least 2 vegetarian meals a week.  Eat more home-cooked food and less restaurant, buffet, and fast food.   Lifestyle  When eating at a restaurant, ask that your food be prepared with less salt or no salt, if possible.  If you drink alcohol: ? Limit how much you use to:  0-1 drink a day for women who are not pregnant.  0-2 drinks a day for men. ? Be aware of how much alcohol is in your drink. In the [place] one drink equals one 12 oz bottle of beer (355 mL), one 5 oz glass of wine (148 mL), or one 1 oz glass of hard liquor (44 mL). General information  Avoid eating more than 2,300 mg of salt a day. If you have hypertension, you may need to reduce your sodium intake to 1,500 mg a day.  Work with your health care provider to maintain a healthy body weight or to lose weight. Ask what an ideal weight is for you.  Get at least 30 minutes of exercise that causes your heart to beat faster (aerobic exercise) most days of the week. Activities may include walking, swimming, or biking.  Work with your health care provider or dietitian to adjust your eating plan to your individual calorie needs. What foods should  I eat? Fruits All fresh, dried, or frozen fruit. Canned fruit in natural juice (without added sugar). Vegetables Fresh or frozen vegetables (raw, steamed, roasted, or grilled). Low-sodium or reduced-sodium tomato and vegetable juice. Low-sodium or reduced-sodium tomato sauce and tomato paste. Low-sodium or reduced-sodium canned vegetables. Grains Whole-grain or whole-wheat bread. Whole-grain or whole-wheat pasta. Brown rice. [place]. Bulgur. Whole-grain and low-sodium cereals. Pita bread. Low-fat, low-sodium crackers. Whole-wheat flour tortillas. Meats and other proteins Skinless chicken or [place]. Ground chicken or [place]. Pork with fat trimmed off. Fish and seafood. Egg whites. Dried beans, peas, or lentils. Unsalted nuts, nut  butters, and seeds. Unsalted canned beans. Lean cuts of beef with fat trimmed off. Low-sodium, lean precooked or cured meat, such as sausages or meat loaves. Dairy Low-fat (1%) or fat-free (skim) milk. Reduced-fat, low-fat, or fat-free cheeses. Nonfat, low-sodium ricotta or cottage cheese. Low-fat or nonfat yogurt. Low-fat, low-sodium cheese. Fats and oils Soft margarine without trans fats. Vegetable oil. Reduced-fat, low-fat, or light mayonnaise and salad dressings (reduced-sodium). Canola, safflower, olive, avocado, soybean, and sunflower oils. Avocado. Seasonings and condiments Herbs. Spices. Seasoning mixes without salt. Other foods Unsalted popcorn and pretzels. Fat-free sweets. The items listed above may not be a complete list of foods and beverages you can eat. Contact a dietitian for more information. What foods should I avoid? Fruits Canned fruit in a light or heavy syrup. Fried fruit. Fruit in cream or butter sauce. Vegetables Creamed or fried vegetables. Vegetables in a cheese sauce. Regular canned vegetables (not low-sodium or reduced-sodium). Regular canned tomato sauce and paste (not low-sodium or reduced-sodium). Regular tomato and vegetable juice (not low-sodium or reduced-sodium). Angie Fava. Olives. Grains Baked goods made with fat, such as croissants, muffins, or some breads. Dry pasta or rice meal packs. Meats and other proteins Fatty cuts of meat. Ribs. Fried meat. Berniece Salines. Bologna, salami, and other precooked or cured meats, such as sausages or meat loaves. Fat from the back of a pig (fatback). Bratwurst. Salted nuts and seeds. Canned beans with added salt. Canned or smoked fish. Whole eggs or egg yolks. Chicken or [place] with skin. Dairy Whole or 2% milk, cream, and half-and-half. Whole or full-fat cream cheese. Whole-fat or sweetened yogurt. Full-fat cheese. Nondairy creamers. Whipped toppings. Processed cheese and cheese spreads. Fats and oils Butter. Stick margarine. Lard.  Shortening. Ghee. Bacon fat. Tropical oils, such as coconut, palm kernel, or palm oil. Seasonings and condiments Onion salt, garlic salt, seasoned salt, table salt, and sea salt. Worcestershire sauce. Tartar sauce. Barbecue sauce. Teriyaki sauce. Soy sauce, including reduced-sodium. Steak sauce. Canned and packaged gravies. Fish sauce. Oyster sauce. Cocktail sauce. Store-bought horseradish. Ketchup. Mustard. Meat flavorings and tenderizers. Bouillon cubes. Hot sauces. Pre-made or packaged marinades. Pre-made or packaged taco seasonings. Relishes. Regular salad dressings. Other foods Salted popcorn and pretzels. The items listed above may not be a complete list of foods and beverages you should avoid. Contact a dietitian for more information. Where to find more information  National Heart, Lung, and Blood Institute: https://wilson-eaton.com/  American Heart Association: www.heart.org  Academy of Nutrition and Dietetics: www.eatright.[place]: www.kidney.org Summary  The DASH eating plan is a healthy eating plan that has been shown to reduce high blood pressure (hypertension). It may also reduce your risk for type 2 diabetes, heart disease, and stroke.  When on the DASH eating plan, aim to eat more fresh fruits and vegetables, whole grains, lean proteins, low-fat dairy, and heart-healthy fats.  With the DASH eating plan, you should limit  salt (sodium) intake to 2,300 mg a day. If you have hypertension, you may need to reduce your sodium intake to 1,500 mg a day.  Work with your health care provider or dietitian to adjust your eating plan to your individual calorie needs. This information is not intended to replace advice given to you by your health care provider. Make sure you discuss any questions you have with your health care provider. Document Revised: 05/06/2019 Document Reviewed: 05/06/2019 Elsevier Patient Education  2021 [employer].

## 2020-09-04 NOTE — Progress Notes (Signed)
Chronic Care Management Pharmacy Note  09/04/2020 Name:  Claire Lewis MRN:  [id number] DOB:  Oct 28, 1945  Subjective: Claire Lewis is an 75 y.o. year old female who is a primary patient of Bedsole, Amy E, MD.  The CCM team was consulted for assistance with disease management and care coordination needs.    Engaged with patient by telephone for follow up visit in response to provider referral for pharmacy case management and/or care coordination services.   Consent to Services:  The patient was given information about Chronic Care Management services, agreed to services, and gave verbal consent prior to initiation of services.  Please see initial visit note for detailed documentation.   Patient Care Team: Jinny Sanders, MD as PCP - General Levonne Lapping as Consulting Physician (Dentistry) [name], [facility] as Pharmacist (Pharmacist)  Recent office visits: 06/04/20- Alma Friendly, NP, UTI  03/01/20- Dr. Eliezer Lofts - PCP - PreDM stable, BP controlled, LDL at goal on statin.   Recent consult visits: 05/29/20- Dr. [name]- Orthoped[facility]: None in previous 6 months  Objective:  Lab Results  Component Value Date   CREATININE 0.88 02/28/2020   BUN 16 02/28/2020   GFR 62.72 02/28/2020   GFRNONAA 77.81 11/23/2009   NA 141 02/28/2020   K 3.8 02/28/2020   CALCIUM 9.3 02/28/2020   CO2 27 02/28/2020   GLUCOSE 104 (H) 02/28/2020    Lab Results  Component Value Date/Time   HGBA1C 5.8 02/28/2020 07:40 AM   GFR 62.72 02/28/2020 07:40 AM   GFR 64.60 02/01/2019 09:17 AM    Lab Results  Component Value Date   CHOL 171 02/28/2020   HDL 46.50 02/28/2020   LDLCALC 89 02/28/2020   TRIG 176.0 (H) 02/28/2020   CHOLHDL 4 02/28/2020    Hepatic Function Latest Ref Rng & Units 02/28/2020 02/01/2019 01/26/2018  Total Protein 6.0 - 8.3 g/dL 7.0 6.4 6.7  Albumin 3.5 - 5.2 g/dL 4.0 4.0 3.8  AST 0 - 37 U/L  18 16 20   ALT 0 - 35 U/L 17 14 17   Alk Phosphatase 39 - 117 U/L 79 76 67  Total Bilirubin 0.2 - 1.2 mg/dL 0.6 0.6 0.6  Bilirubin, Direct 0.0 - 0.3 mg/dL - - -   Clinical ASCVD: No  The 10-year ASCVD risk score Mikey Bussing DC Jr., et al., 2013) is: 31.8%   Values used to calculate the score:     Age: 63 years     Sex: Female     Is Non-Hispanic African American: No     Diabetic: No     Tobacco smoker: No     Systolic Blood Pressure: 329 mmHg     Is BP treated: Yes     HDL Cholesterol: 46.5 mg/dL     Total Cholesterol: 171 mg/dL    Depression screen [facility] 2/9 02/29/2020 02/08/2019 01/26/2018  Decreased Interest 0 0 0  Down, Depressed, Hopeless 0 0 0  PHQ - 2 Score 0 0 0  Altered sleeping 0 - 0  Tired, decreased energy 0 - 0  Change in appetite 0 - 0  Feeling bad or failure about yourself  0 - 0  Trouble concentrating 0 - 0  Moving slowly or fidgety/restless 0 - 0  Suicidal thoughts 0 - 0  PHQ-9 Score 0 - 0  Difficult doing work/chores Not difficult at all - Not difficult at all    Social History   Tobacco Use  Smoking  Status Never Smoker  Smokeless Tobacco Never Used   BP Readings from Last 3 Encounters:  06/15/20 (S) (!) 164/85  06/04/20 120/70  03/01/20 130/72   Pulse Readings from Last 3 Encounters:  06/15/20 80  06/04/20 61  03/01/20 62   Wt Readings from Last 3 Encounters:  06/04/20 215 lb (97.5 kg)  03/01/20 211 lb (95.7 kg)  02/08/19 198 lb 12 oz (90.2 kg)   BMI Readings from Last 3 Encounters:  06/04/20 35.23 kg/m  03/01/20 34.58 kg/m  02/08/19 32.82 kg/m    Assessment/Interventions: Review of patient past medical history, allergies, medications, health status, including review of consultants reports, laboratory and other test data, was performed as part of comprehensive evaluation and provision of chronic care management services.   SDOH:  (Social Determinants of Health) assessments and interventions performed: Yes SDOH Interventions   Flowsheet Row Most  Recent Value  SDOH Interventions   Financial Strain Interventions Intervention Not Indicated  [Medications affordable]      CCM Care Plan  No Known Allergies  Medications Reviewed Today    Reviewed by [name], [facility] (Pharmacist) on 09/04/20 at [phone]  Med List Status: <None>  Medication Order Taking? Sig Documenting Provider Last Dose Status Informant  amLODipine (NORVASC) 5 MG tablet [id number] Yes Take 1 tablet (5 mg total) by mouth daily. Jinny Sanders, MD Taking Active   atorvastatin (LIPITOR) 20 MG tablet [id number] Yes Take 1 tablet (20 mg total) by mouth daily. Jinny Sanders, MD Taking Active   Calcium Carbonate-Vitamin D 600-400 MG-UNIT tablet [id number] Yes Take 1 tablet by mouth daily. [provider] Taking Active   estradiol (ESTRACE) 0.5 MG tablet [id number] Yes Take 1 tablet (0.5 mg total) by mouth every other day.  Patient taking differently: Take 0.5 mg by mouth. Every three days *weaning off*   Bedsole, Amy E, MD Taking Active Self  halobetasol (ULTRAVATE) 0.05 % ointment [id number] Yes APPLY TO AFFECTED AREA TWICE A DAY Bedsole, Amy E, MD Taking Active   metoprolol succinate (TOPROL-XL) 50 MG 24 hr tablet [id number] Yes Take 1 tablet by mouth once daily with or immediately following a meal Bedsole, Amy E, MD Taking Active   Omega-3 Fatty Acids (FISH OIL) 1200 MG CAPS [id number] Yes Take 1 tablet by mouth daily. [provider] Taking Active Self  omeprazole (PRILOSEC) 40 MG capsule [id number] Yes Take 40 mg by mouth daily. [provider] Taking Active           Patient Active Problem List   Diagnosis Date Noted   Dysuria 06/04/2020   Prediabetes 02/08/2019   Counseling regarding end of life decision making 11/07/2014   Obesity (BMI 30.0-34.9) 11/07/2014   Rotator cuff impingement syndrome 01/08/2011   PURE HYPERCHOLESTEROLEMIA 05/24/2009   Essential hypertension, benign 01/03/2009   ALLERGIC RHINITIS 01/03/2009   GERD  (gastroesophageal reflux disease) 48/25/0037   OTHER LICHEN NOT ELSEWHERE CLASSIFIED 01/03/2009   COLONIC POLYPS, HX OF 01/03/2009    Immunization History  Administered Date(s) Administered   Fluad Quad(high Dose 65+) 02/08/2019, 03/01/2020   Influenza Whole 04/16/2010   Influenza, High Dose Seasonal PF 06/15/2015, 04/13/2018   PFIZER(Purple Top)SARS-COV-2 Vaccination 07/23/2019, 08/13/2019, 04/13/2020   Pneumococcal Conjugate-13 11/07/2014   Pneumococcal Polysaccharide-23 10/04/2013   Td 06/19/2009   Zoster 05/16/2008    Conditions to be addressed/monitored:  Hypertension and Hyperlipidemia  Care Plan : [place]  Updates made by [name], [place] since 09/04/2020 12:00 AM    Problem: CHL AMB "  PATIENT-SPECIFIC PROBLEM"     Long-Range Goal: Disease Management   Start Date: 09/04/2020  Priority: High  Note:    Current Barriers:   Elevated blood pressure on home monitoring  Pharmacist Clinical Goal(s):   Patient will adhere to plan to improve blood pressure with lifestyle changes with goal of BP < 140/90  Interventions:  1:1 collaboration with Jinny Sanders, MD regarding development and update of comprehensive plan of care as evidenced by provider attestation and co-signature  Inter-disciplinary care team collaboration (see longitudinal plan of care)  Comprehensive medication review performed; medication list updated in electronic medical record  Hypertension (BP goal <140/90) -Uncontrolled -Current treatment:  Amlodipine 5 mg - 1 tablet daily  Metoprolol succinate 50 mg - 1 tablet daily  -Medications previously tried: none  -Recently purchased a new home BP monitor, checking daily with automatic monitor, upper arm  -Current home readings: 164/81 (this morning), 176/82, 179/80, 174/68, 162/73 -Current dietary habits: loves salt  -Current exercise habits: exercise has decreased a lot with right knee pain -Taking ibuprofen 200 mg about  1-2 times a day for past three months. Last week took Zyrtec for allergies, denies any pseudoephedrine. Reports she has gained some weight. Does not watch salt intake. Denies any swelling. -Denies hypotensive/hypertensive symptoms -Educated on BP goals and benefits of medications for prevention of heart attack, stroke and kidney damage; Proper BP monitoring technique; -Counseled to monitor BP at home daily, document, and provide log at future appointments -Discussed medication change versus some lifestyle changes. Will trial lifestyle for 2 weeks.  -Recommended to continue current medication; Limit salt, switch ibuprofen to Tylenol, try increasing exercise as able. Continue to check BP daily. Instructed her to call if any alarm symptoms or if BP remains elevated. CMA call in 2 weeks for BP log to assess lifestyle changes (call around 8:30 AM).  Hyperlipidemia: (LDL goal < 100) -Controlled -Current treatment:  Atorvastatin 20 mg - 1 tablet daily -Medications previously tried: none -Recommended to continue current medication Assessed refill history - > 30 day gap in refills. Pt denies adherence gaps. Reports auto refill results in early fills and excess medications.  Patient Goals/Self-Care Activities  Patient will:  - engage in dietary modifications by limiting salt - try a salt substitute, limit processed foods and eating out  -switch ibuprofen to Tylenol, let us know if this does not control your pain  Follow Up Plan: The care management team will reach out to the patient again over the next 14 days.      Medication Assistance: None required.  Patient affirms current coverage meets needs.  Patient's preferred pharmacy is:  [id number] [place] [address] [place]Phone: [phone]: [phone] [place] [place] [address]Phone: [phone]: [phone] Pt endorses compliance. Takes all her medications in the morning.  Care Plan and Follow Up Patient Decision:  Patient agrees to Care Plan and Follow-up.  MDebbora Dus PharmD Clinical Pharmacist [phone]

## 2020-09-05 ENCOUNTER — Telehealth: Payer: Self-pay | Admitting: *Deleted

## 2020-09-05 NOTE — Telephone Encounter (Signed)
Noted. Thank you for seeing her.

## 2020-09-05 NOTE — Telephone Encounter (Signed)
Patient called stating that she just purchased a new blood pressure kit about a week ago. Patient stated that her blood pressure has been running high. Patient stated this morning her blood pressure was 183/77 and this afternoon it was 176/75. Patient stated that the lowest her blood pressure has been was 164/81 and the highest 190/81. Patient stated that she has not missed any of her medications. Patient stated that she has had headaches off and on for years but nothing new. Patient denies chest pain, dizziness, SOB, blurred vision or any other symptoms other than feeling real tired for about a month. Patient scheduled to see Dr. Lorelei Pont tomorrow 09/06/20 at 11:00. Patient was advised to bring her blood pressure kit with her tomorrow for her appointment. Patient was given ER/911 precautions and she verbalized understanding. Patient was advised to rest and drink plenty of fluids.

## 2020-09-06 ENCOUNTER — Encounter: Payer: Self-pay | Admitting: Family Medicine

## 2020-09-06 ENCOUNTER — Other Ambulatory Visit: Payer: Self-pay

## 2020-09-06 ENCOUNTER — Ambulatory Visit (INDEPENDENT_AMBULATORY_CARE_PROVIDER_SITE_OTHER): Payer: PPO | Admitting: Family Medicine

## 2020-09-06 VITALS — BP 164/72 | HR 64 | Temp 98.1°F | Ht 65.5 in | Wt 220.8 lb

## 2020-09-06 DIAGNOSIS — I10 Essential (primary) hypertension: Secondary | ICD-10-CM

## 2020-09-06 DIAGNOSIS — R635 Abnormal weight gain: Secondary | ICD-10-CM | POA: Diagnosis not present

## 2020-09-06 MED ORDER — AMLODIPINE BESYLATE 10 MG PO TABS: 10.0000 mg | ORAL_TABLET | Freq: Every day | ORAL | 1 refills | Status: DC

## 2020-09-06 NOTE — Progress Notes (Signed)
Claire Jurgens T. Tammi Boulier, MD, [place] at [facility] [place], [address]  Phone: [phone]   FAX: [phone]  Claire Lewis - 75 y.o. female   MRN [id number]   Date of Birth: [birth year]  Date: 09/06/2020   PCP: Jinny Sanders, MD   Referral: Jinny Sanders, MD  Chief Complaint  Patient presents with   Hypertension   Fatigue    This visit occurred during the SARS-CoV-2 public health emergency.  Safety protocols were in place, including screening questions prior to the visit, additional usage of staff PPE, and extensive cleaning of exam room while observing appropriate contact time as indicated for disinfecting solutions.   Subjective:   Claire Lewis is a 75 y.o. very pleasant female patient with Body mass index is 36.18 kg/m. who presents with the following:  I am asked to work the patient in today with some quite high blood pressures.  She is on Norvasc 5 mg. And Toprol XL at 50 mg  Got a new cuff at home  Has been taking it over the last week or wo.  268-341 systolic at home. diast has been only slightly high  Has felt really tired over the last couple of months Has put on some weight Twenty five pounds.  Last fall had a knee injury. Had an aspiration.  Had a UTI in December.   Review of Systems is noted in the HPI, as appropriate  Objective:   BP (!) 164/72    Pulse 64    Temp 98.1 F (36.7 C) (Temporal)    Ht 5' 5.5" (1.664 m)    Wt 220 lb 12 oz (100.1 kg)    SpO2 97%    BMI 36.18 kg/m   GEN: No acute distress; alert,appropriate. PULM: Breathing comfortably in no respiratory distress PSYCH: Normally interactive.   Laboratory and Imaging Data:  Assessment and Plan:     ICD-10-CM   1. Essential hypertension, benign  I10   2. Weight gain  R63.5    Essential hypertension known, exacerbation, medication management with increase from Norvasc 5 mg to 10  mg.  She also has gained weight, I think that this is very likely contributing to #1.  Patient Instructions  Take #2 tablets of Amlodipine 5 mg until the new 10 mg tablets are delivered.   Keep a record over the next month, and then follow-up with Dr. Diona Browner.  Meds ordered this encounter  Medications   amLODipine (NORVASC) 10 MG tablet    Sig: Take 1 tablet (10 mg total) by mouth daily.    Dispense:  90 tablet    Refill:  1   Medications Discontinued During This Encounter  Medication Reason   amLODipine (NORVASC) 5 MG tablet     Signed,  Irvin Bastin T. Moussa Wiegand, MD   Outpatient Encounter Medications as of 09/06/2020  Medication Sig   atorvastatin (LIPITOR) 20 MG tablet Take 1 tablet (20 mg total) by mouth daily.   Calcium Carbonate-Vitamin D 600-400 MG-UNIT tablet Take 1 tablet by mouth daily.   estradiol (ESTRACE) 0.5 MG tablet Take 1 tablet (0.5 mg total) by mouth every other day. (Patient taking differently: Take 0.5 mg by mouth. Every three days *weaning off*)   halobetasol (ULTRAVATE) 0.05 % ointment APPLY TO AFFECTED AREA TWICE A DAY   metoprolol succinate (TOPROL-XL) 50 MG 24 hr tablet Take 1 tablet by mouth once daily with or  immediately following a meal   Omega-3 Fatty Acids (FISH OIL) 1200 MG CAPS Take 1 tablet by mouth daily.   omeprazole (PRILOSEC) 40 MG capsule Take 40 mg by mouth daily.   [DISCONTINUED] amLODipine (NORVASC) 5 MG tablet Take 1 tablet (5 mg total) by mouth daily.   amLODipine (NORVASC) 10 MG tablet Take 1 tablet (10 mg total) by mouth daily.   No facility-administered encounter medications on file as of 09/06/2020.

## 2020-09-06 NOTE — Patient Instructions (Signed)
Take #2 tablets of Amlodipine 5 mg until the new 10 mg tablets are delivered.

## 2020-09-07 ENCOUNTER — Telehealth: Payer: Self-pay

## 2020-09-07 NOTE — Telephone Encounter (Signed)
Noted  

## 2020-09-07 NOTE — Telephone Encounter (Signed)
Claire Lewis with [facility] pharmacy wanted to verify the rx just received was accurate; [name] in 09/06/20 note DR Copland changed pt to amlodipine 10 mg taking 1 tab po daily. #90 x 1. [name] voiced understanding and will get amlodipine 10 mg processing for delivery. [name] will also d/c amlodipine 5 mg refills so no confusion at next refill. Sending note to Dr Diona Browner as Juluis Rainier.

## 2020-09-20 ENCOUNTER — Telehealth: Payer: Self-pay

## 2020-09-20 NOTE — Chronic Care Management (AMB) (Addendum)
Chronic Care Management Pharmacy Assistant   Name: MYCALA WARSHAWSKY  MRN: [id number] DOB: 12/11/1945  Reason for Encounter: Disease State  HTN   Conditions to be addressed/monitored: HTN  Recent office visits:  09/06/2020  Dr.Spencer Copland - started Amlodipine 10 mg  Recent consult visits: 06/15/2020  Urgent Care - Nitrofurantoin Monohydrate 100 mg twice a day and phenazopyridine HCL 200 mg 3 times a day  Hospital visits:  None in previous 6 months  Medications: Outpatient Encounter Medications as of 09/20/2020  Medication Sig   amLODipine (NORVASC) 10 MG tablet Take 1 tablet (10 mg total) by mouth daily.   atorvastatin (LIPITOR) 20 MG tablet Take 1 tablet (20 mg total) by mouth daily.   Calcium Carbonate-Vitamin D 600-400 MG-UNIT tablet Take 1 tablet by mouth daily.   estradiol (ESTRACE) 0.5 MG tablet Take 1 tablet (0.5 mg total) by mouth every other day. (Patient taking differently: Take 0.5 mg by mouth. Every three days *weaning off*)   halobetasol (ULTRAVATE) 0.05 % ointment APPLY TO AFFECTED AREA TWICE A DAY   metoprolol succinate (TOPROL-XL) 50 MG 24 hr tablet Take 1 tablet by mouth once daily with or immediately following a meal   Omega-3 Fatty Acids (FISH OIL) 1200 MG CAPS Take 1 tablet by mouth daily.   omeprazole (PRILOSEC) 40 MG capsule Take 40 mg by mouth daily.   No facility-administered encounter medications on file as of 09/20/2020.    Recent Office Vitals: BP Readings from Last 3 Encounters:  09/06/20 (!) 164/72  06/15/20 (S) (!) 164/85  06/04/20 120/70   Pulse Readings from Last 3 Encounters:  09/06/20 64  06/15/20 80  06/04/20 61    Wt Readings from Last 3 Encounters:  09/06/20 220 lb 12 oz (100.1 kg)  06/04/20 215 lb (97.5 kg)  03/01/20 211 lb (95.7 kg)     Kidney Function Lab Results  Component Value Date/Time   CREATININE 0.88 02/28/2020 07:40 AM   CREATININE 0.86 02/01/2019 09:17 AM   GFR 62.72 02/28/2020 07:40 AM   GFRNONAA 77.81  11/23/2009 09:00 AM    BMP Latest Ref Rng & Units 02/28/2020 02/01/2019 01/26/2018  Glucose 70 - 99 mg/dL 104(H) 105(H) 99  BUN 6 - 23 mg/dL 16 18 15   Creatinine 0.40 - 1.20 mg/dL 0.88 0.86 0.90  Sodium 135 - 145 mEq/L 141 141 141  Potassium 3.5 - 5.1 mEq/L 3.8 4.0 4.3  Chloride 96 - 112 mEq/L 105 106 106  CO2 19 - 32 mEq/L 27 28 30   Calcium 8.4 - 10.5 mg/dL 9.3 9.1 9.6  . Current antihypertensive regimen:  Amlodipine 10 mg - 1 tablet daily (increased to 10 mg by Dr. Lorelei Pont 09/06/2020) Metoprolol succinate 50 mg - 1 tablet daily   Patient verbally confirms she is taking the above medications as directed. Yes  How often are you checking your Blood Pressure? daily  she checks her blood pressure in the morning after taking her medication.  Current home BP readings:  DATE:             BP               PULSE 09/21/2020  148/69  - 09/20/2020  146/70  - 09/19/2020  147/72  -  Wrist or arm cuff: she uses arm cuff Caffeine intake: none  Salt intake: moderate use OTC medications including pseudoephedrine or NSAIDs? None   Any readings above 180/120? No If yes any symptoms of hypertensive emergency? patient denies any symptoms of  high blood pressure  What recent interventions/DTPs have been made by any provider to improve Blood Pressure control since last CPP Visit:   09/06/2020  Increased Amlodipine Bes. 10 mg  Any recent hospitalizations or ED visits since last visit with CPP? No  What diet changes have been made to improve Blood Pressure Control?    none identified.  What exercise is being done to improve your Blood Pressure Control?   The patient reports she is active with household cleaning, and she continues to shop at the grocery store, moving around and carrying groceries. The patient reports she was very tired prior to the  09/06/2020  Increased Amlodipine Bes. 10 mg she does feel like she is regaining energy.  Adherence Review: Is the patient currently on ACE/ARB medication?  No Does the patient have >5 day gap between last estimated fill dates? No gaps in statin adherence.   Star Rating Drugs:  Medication:  Last Fill: Day Supply Atorvastatin 20mg . 08/23/2020 90DS  Follow-Up:  Pharmacist Review  [name], CPP notified  Avel Sensor, [place] Assistant [phone]  BP has improved significantly with amlodipine dose increase (from SBP 160-170s to 140s). Still elevated but will allow a couple more weeks to see if BP continues to improve. Continue lifestyle changes - limit salt, avoid NSAIDs. Pt has PCP follow up end of April. CCM in 3 months.  [name], PharmD Clinical Pharmacist [facility] [phone]

## 2020-09-26 DIAGNOSIS — I10 Essential (primary) hypertension: Secondary | ICD-10-CM | POA: Diagnosis not present

## 2020-09-26 DIAGNOSIS — K219 Gastro-esophageal reflux disease without esophagitis: Secondary | ICD-10-CM | POA: Diagnosis not present

## 2020-10-09 ENCOUNTER — Other Ambulatory Visit: Payer: Self-pay

## 2020-10-09 ENCOUNTER — Encounter: Payer: Self-pay | Admitting: Family Medicine

## 2020-10-09 ENCOUNTER — Ambulatory Visit (INDEPENDENT_AMBULATORY_CARE_PROVIDER_SITE_OTHER): Payer: PPO | Admitting: Family Medicine

## 2020-10-09 DIAGNOSIS — I10 Essential (primary) hypertension: Secondary | ICD-10-CM | POA: Diagnosis not present

## 2020-10-09 NOTE — Assessment & Plan Note (Signed)
Blood pressure improve with improved lifestyle changes and increase in amlodipine to 10 mg daily. She is tolerating this  Increased dose well .  Also continue toprol xl 50 mg daily Encouraged exercise, weight loss, healthy eating habits.

## 2020-10-09 NOTE — Progress Notes (Signed)
Patient ID: Claire Lewis, female    DOB: Sep 08, 1945, 75 y.o.   MRN: [id number]  This visit was conducted in person.  BP 127/77   Pulse 64   Temp 97.9 F (36.6 C) (Temporal)   Ht 5' 5.5" (1.664 m)   Wt 220 lb 8 oz (100 kg)   SpO2 97%   BMI 36.14 kg/m    CC:  Chief Complaint  Patient presents with  . Follow-up    HTN    Subjective:   HPI: Claire Lewis is a 75 y.o. female presenting on 10/09/2020 for Follow-up (HTN)  Seen on 09/06/2020 by Dr. Loletha Grayer with elevated BP 164/72 165-190 at home. Had noted some weight gain felt to be likely cause. Increased norvasc to 10 mg daily. Toprol XL at 50 mg Hypertension:     BP Readings from Last 3 Encounters:  10/09/20 127/77  09/06/20 (!) 164/72  06/15/20 (S) (!) 164/85  Using medication without problems or lightheadedness:  None Chest pain with exertion: none Edema:  Initially now better. Short of breath: none Average home BPs: 134/63-143/66 Other issues:  Wt Readings from Last 3 Encounters:  10/09/20 220 lb 8 oz (100 kg)  09/06/20 220 lb 12 oz (100.1 kg)  06/04/20 215 lb (97.5 kg)   working on healthy eating, decreasing salt and walking some.      Relevant past medical, surgical, family and social history reviewed and updated as indicated. Interim medical history since our last visit reviewed. Allergies and medications reviewed and updated. Outpatient Medications Prior to Visit  Medication Sig Dispense Refill  . amLODipine (NORVASC) 10 MG tablet Take 1 tablet (10 mg total) by mouth daily. 90 tablet 1  . atorvastatin (LIPITOR) 20 MG tablet Take 1 tablet (20 mg total) by mouth daily. 90 tablet 1  . Calcium Carbonate-Vitamin D 600-400 MG-UNIT tablet Take 1 tablet by mouth daily.    Marland Kitchen estradiol (ESTRACE) 0.5 MG tablet Take 1 tablet (0.5 mg total) by mouth every other day. (Patient taking differently: Take 0.5 mg by mouth. Take one tablet 2 times a week.  Patient is trying to wean off medication) 45 tablet 3  . halobetasol  (ULTRAVATE) 0.05 % ointment APPLY TO AFFECTED AREA TWICE A DAY 15 g 0  . metoprolol succinate (TOPROL-XL) 50 MG 24 hr tablet Take 1 tablet by mouth once daily with or immediately following a meal 90 tablet 1  . Omega-3 Fatty Acids (FISH OIL) 1200 MG CAPS Take 1 tablet by mouth daily.    Marland Kitchen omeprazole (PRILOSEC) 40 MG capsule Take 40 mg by mouth daily.     No facility-administered medications prior to visit.     Per HPI unless specifically indicated in ROS section below Review of Systems  Constitutional: Negative for fatigue and fever.  HENT: Negative for congestion.   Eyes: Negative for pain.  Respiratory: Negative for cough and shortness of breath.   Cardiovascular: Negative for chest pain, palpitations and leg swelling.  Gastrointestinal: Negative for abdominal pain.  Genitourinary: Negative for dysuria and vaginal bleeding.  Musculoskeletal: Negative for back pain.  Neurological: Negative for syncope, light-headedness and headaches.  Psychiatric/Behavioral: Negative for dysphoric mood.   Objective:  BP 127/77   Pulse 64   Temp 97.9 F (36.6 C) (Temporal)   Ht 5' 5.5" (1.664 m)   Wt 220 lb 8 oz (100 kg)   SpO2 97%   BMI 36.14 kg/m   Wt Readings from Last 3 Encounters:  10/09/20 220 lb  8 oz (100 kg)  09/06/20 220 lb 12 oz (100.1 kg)  06/04/20 215 lb (97.5 kg)      Physical Exam Constitutional:      General: She is not in acute distress.    Appearance: Normal appearance. She is well-developed. She is obese. She is not ill-appearing or toxic-appearing.  HENT:     Head: Normocephalic.     Right Ear: Hearing, tympanic membrane, ear canal and external ear normal. Tympanic membrane is not erythematous, retracted or bulging.     Left Ear: Hearing, tympanic membrane, ear canal and external ear normal. Tympanic membrane is not erythematous, retracted or bulging.     Nose: No mucosal edema or rhinorrhea.     Right Sinus: No maxillary sinus tenderness or frontal sinus tenderness.      Left Sinus: No maxillary sinus tenderness or frontal sinus tenderness.     Mouth/Throat:     Pharynx: Uvula midline.  Eyes:     General: Lids are normal. Lids are everted, no foreign bodies appreciated.     Conjunctiva/sclera: Conjunctivae normal.     Pupils: Pupils are equal, round, and reactive to light.  Neck:     Thyroid: No thyroid mass or thyromegaly.     Vascular: No carotid bruit.     Trachea: Trachea normal.  Cardiovascular:     Rate and Rhythm: Normal rate and regular rhythm.     Pulses: Normal pulses.     Heart sounds: Normal heart sounds, S1 normal and S2 normal. No murmur heard. No friction rub. No gallop.   Pulmonary:     Effort: Pulmonary effort is normal. No tachypnea or respiratory distress.     Breath sounds: Normal breath sounds. No decreased breath sounds, wheezing, rhonchi or rales.  Abdominal:     General: Bowel sounds are normal.     Palpations: Abdomen is soft.     Tenderness: There is no abdominal tenderness.  Musculoskeletal:     Cervical back: Normal range of motion and neck supple.  Skin:    General: Skin is warm and dry.     Findings: No rash.  Neurological:     Mental Status: She is alert.  Psychiatric:        Mood and Affect: Mood is not anxious or depressed.        Speech: Speech normal.        Behavior: Behavior normal. Behavior is cooperative.        Thought Content: Thought content normal.        Judgment: Judgment normal.       Results for orders placed or performed during the hospital encounter of 06/15/20  Urine Culture   Specimen: Urine, Clean Catch  Result Value Ref Range   Specimen Description URINE, CLEAN CATCH    Special Requests      Normal Performed at [facility], [place] [address], [place], [place] [address]    Culture >=100,000 COLONIES/mL ESCHERICHIA COLI (A)    Report Status 06/18/2020 FINAL    Organism ID, Bacteria ESCHERICHIA COLI (A)       Susceptibility   Escherichia coli - MIC*    AMPICILLIN <=2  SENSITIVE Sensitive     CEFAZOLIN <=4 SENSITIVE Sensitive     CEFEPIME <=0.12 SENSITIVE Sensitive     CEFTRIAXONE <=0.25 SENSITIVE Sensitive     CIPROFLOXACIN <=0.25 SENSITIVE Sensitive     GENTAMICIN <=1 SENSITIVE Sensitive     IMIPENEM <=0.25 SENSITIVE Sensitive     NITROFURANTOIN <=  16 SENSITIVE Sensitive     TRIMETH/SULFA <=20 SENSITIVE Sensitive     AMPICILLIN/SULBACTAM <=2 SENSITIVE Sensitive     PIP/TAZO <=4 SENSITIVE Sensitive     * >=100,000 COLONIES/mL ESCHERICHIA COLI  POCT urinalysis dipstick  Result Value Ref Range   Color, UA yellow yellow   Clarity, UA cloudy (A) clear   Glucose, UA negative negative mg/dL   Bilirubin, UA negative negative   Ketones, POC UA negative negative mg/dL   Spec Grav, UA <=1.005 (A) 1.010 - 1.025   Blood, UA large (A) negative   pH, UA 5.0 5.0 - 8.0   Protein Ur, POC trace (A) negative mg/dL   Urobilinogen, UA 0.2 0.2 or 1.0 E.U./dL   Nitrite, UA Positive (A) Negative   Leukocytes, UA Small (1+) (A) Negative    This visit occurred during the SARS-CoV-2 public health emergency.  Safety protocols were in place, including screening questions prior to the visit, additional usage of staff PPE, and extensive cleaning of exam room while observing appropriate contact time as indicated for disinfecting solutions.   COVID 19 screen:  No recent travel or known exposure to COVID19 The patient denies respiratory symptoms of COVID 19 at this time. The importance of social distancing was discussed today.   Assessment and Plan    Problem List Items Addressed This Visit    Essential hypertension, benign    Blood pressure improve with improved lifestyle changes and increase in amlodipine to 10 mg daily. She is tolerating this  Increased dose well .  Also continue toprol xl 50 mg daily Encouraged exercise, weight loss, healthy eating habits.           Eliezer Lofts, MD

## 2020-10-09 NOTE — Patient Instructions (Signed)
Continue working on healthy eating , low salt diet and regular exercsie.  Continue current med dose.

## 2020-11-05 IMAGING — MG DIGITAL SCREENING BILAT W/ TOMO W/ CAD
6 of 10 series · 6 of 30 positions shown · non-contrast
Comparison: Previous exam(s).

CLINICAL DATA: Screening.

EXAM:
DIGITAL SCREENING BILATERAL MAMMOGRAM WITH TOMO AND CAD

[R CC synth-2D]
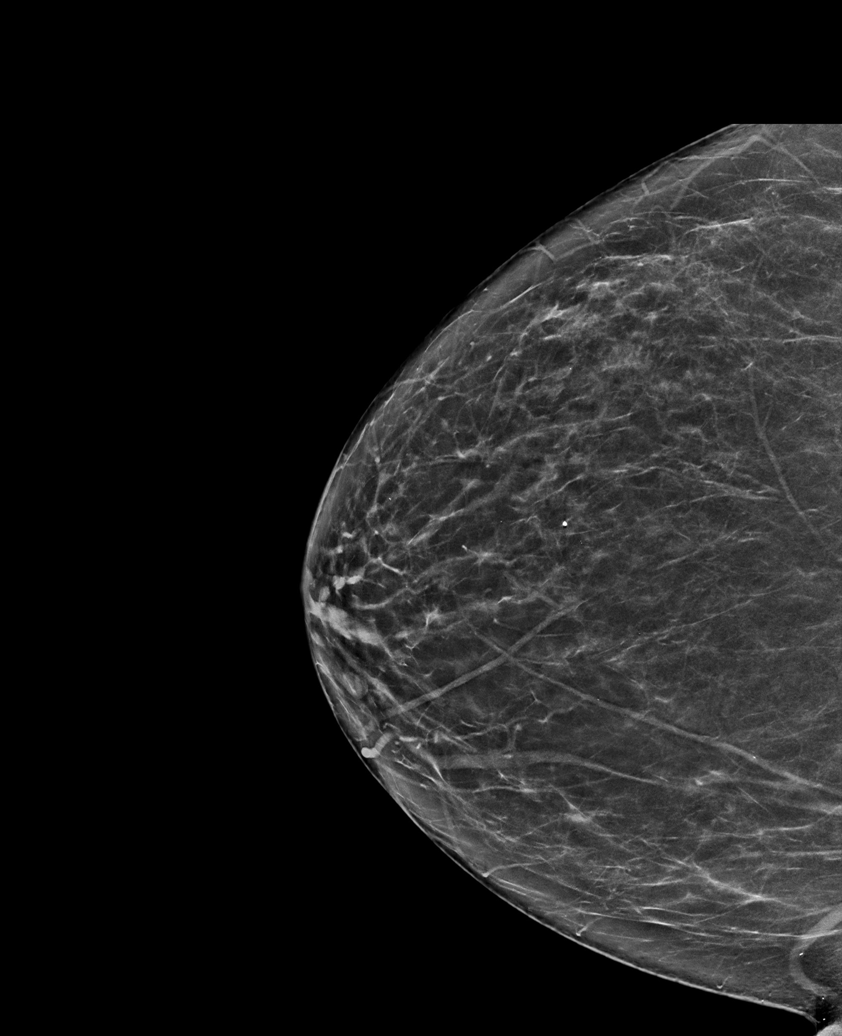

[L MLO synth-2D]
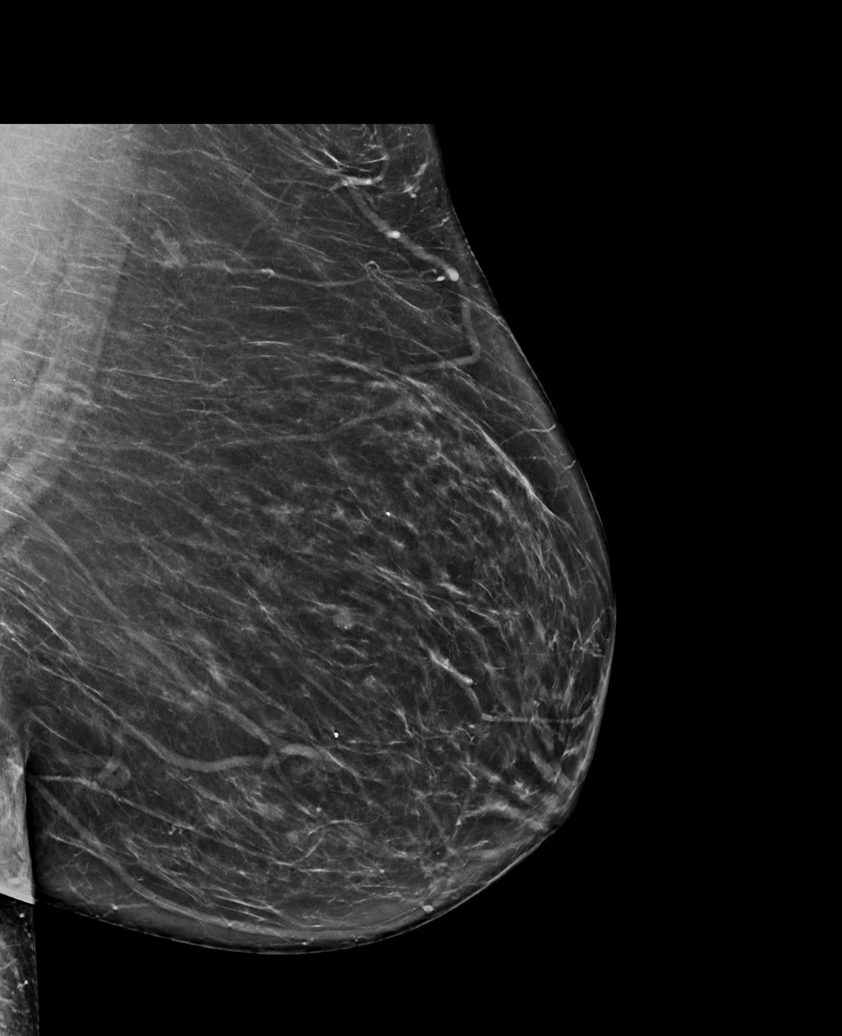

[R MLO synth-2D]
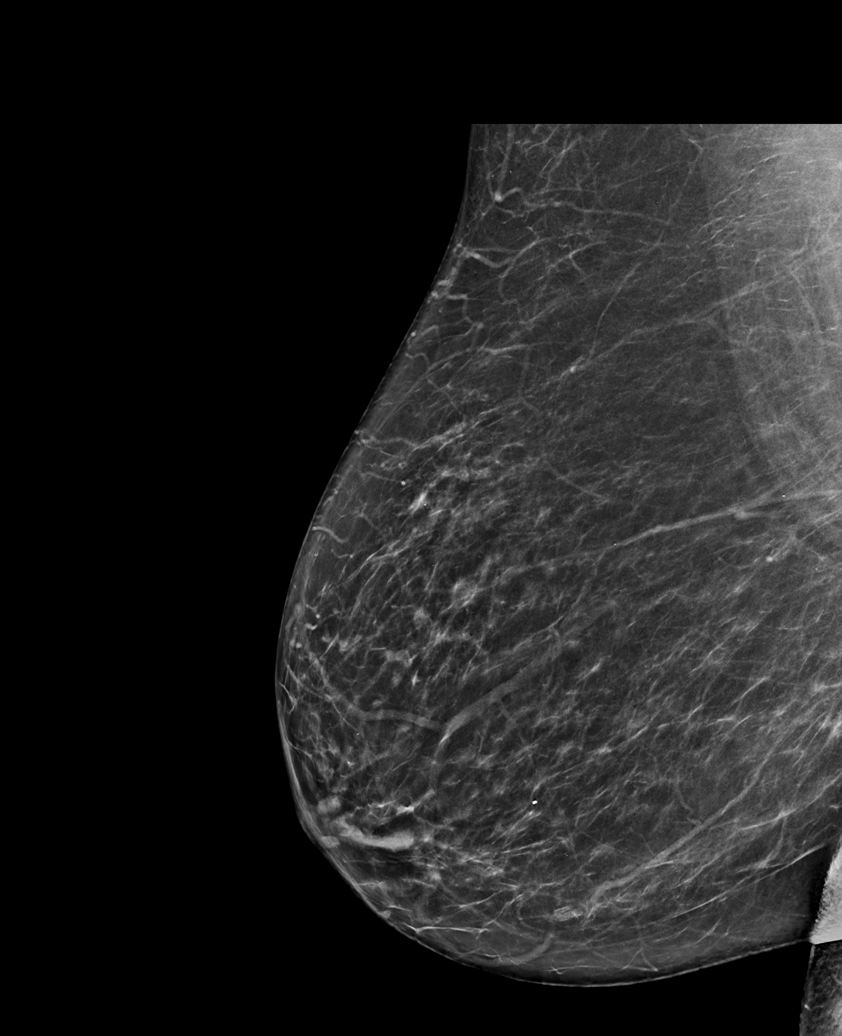

[L CC synth-2D (1 of 2)]
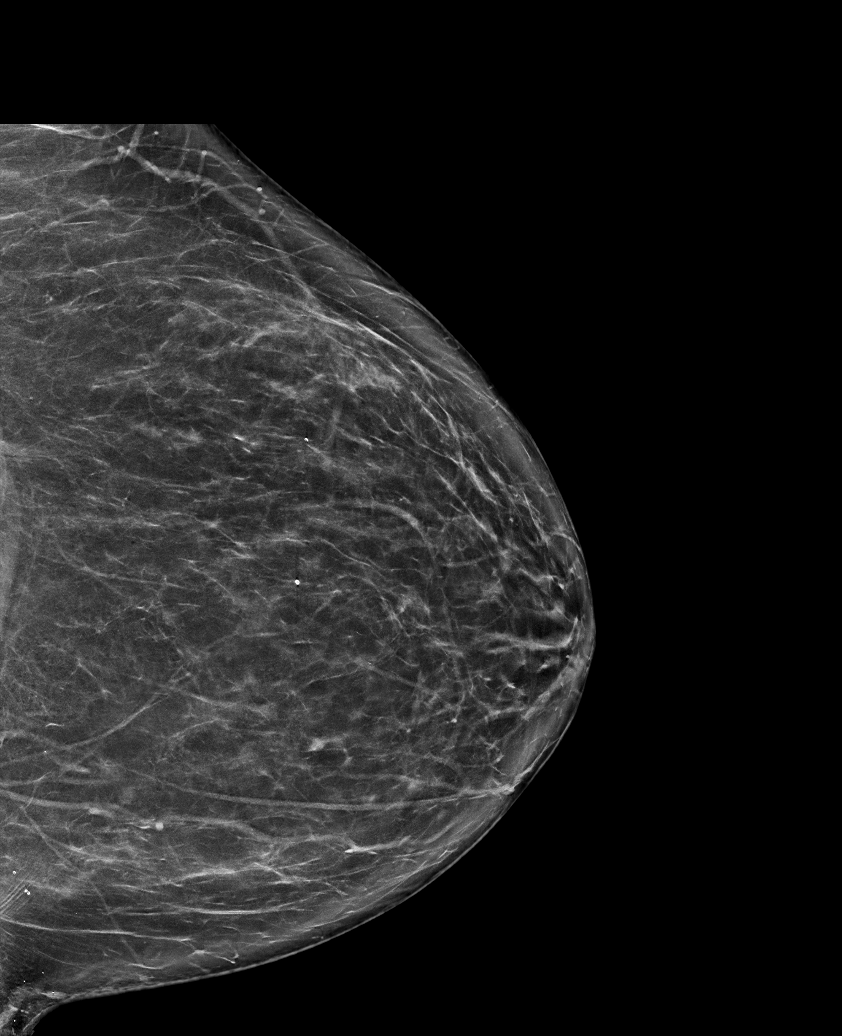

[L CC synth-2D (2 of 2)]
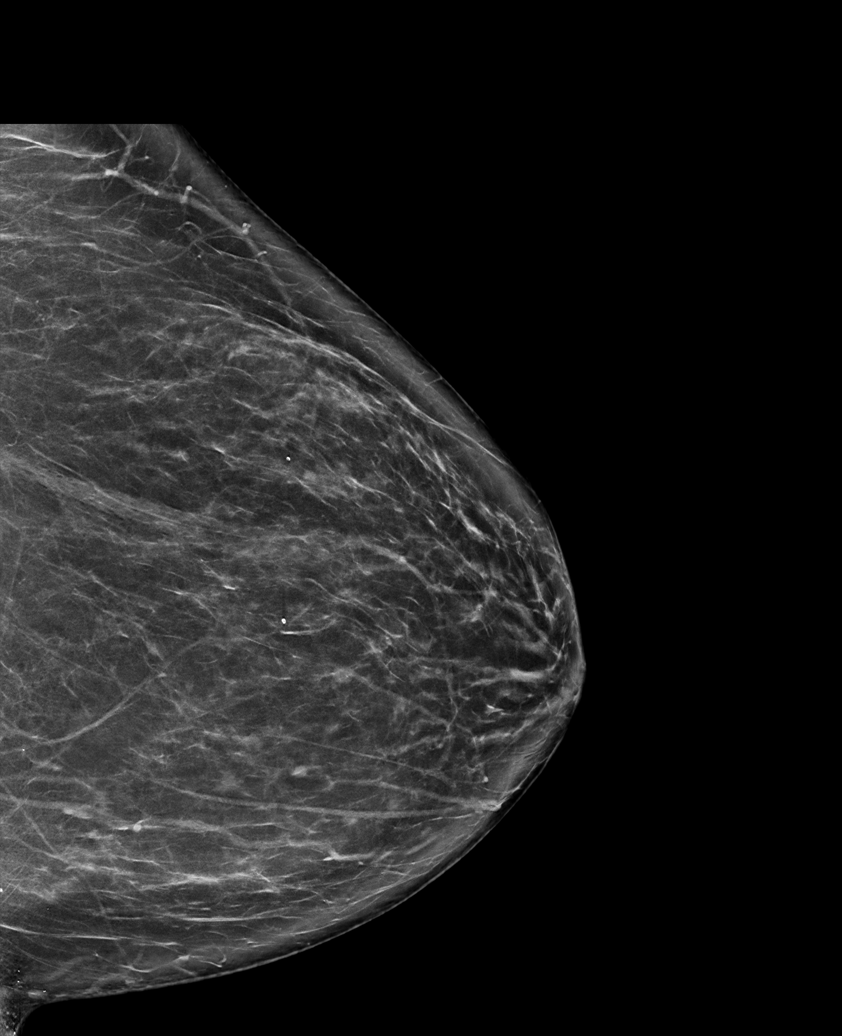

[L MLO tomo · tomo slice 41/81.0]
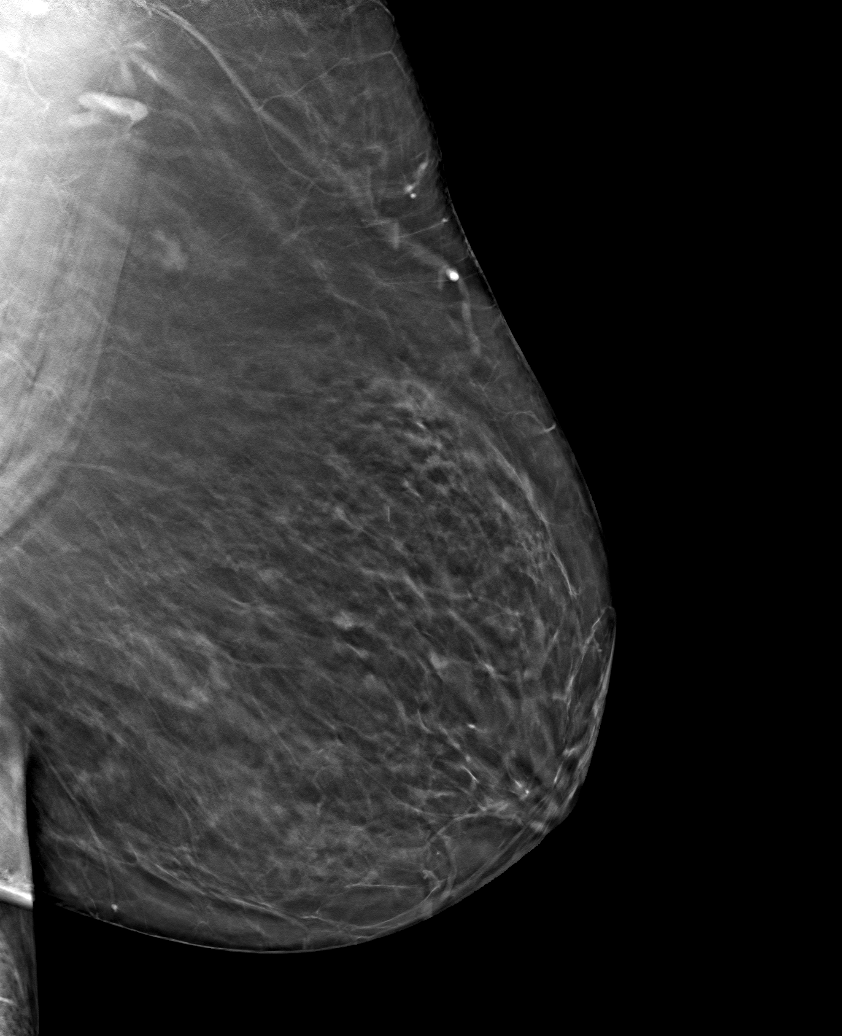

[6 of 30 positions shown; findings below may reference images not displayed]

ACR Breast Density Category b: There are scattered areas of
fibroglandular density.
FINDINGS: There are no findings suspicious for malignancy. Images were
processed with CAD.
IMPRESSION: No mammographic evidence of malignancy. A result letter of this
screening mammogram will be mailed directly to the patient.

RECOMMENDATION:
Screening mammogram in one year. (Code:[id number]-775)

BI-RADS CATEGORY  1: Negative.

## 2020-12-19 ENCOUNTER — Telehealth: Payer: Self-pay

## 2020-12-19 NOTE — Chronic Care Management (AMB) (Addendum)
    Chronic Care Management Pharmacy Assistant   Name: Claire Lewis  MRN: [id number] DOB: 12-14-1945  Reason for Encounter: Reminder Call   Recent office visits:  10/09/20 - Dr.Bedsole PCP - No medication changes. Continue working on healthy eating, low salt diet and regular exercise.  Follow up 5 months.  Recent consult visits:  09/26/20 - Gastroenterology - no data found  Hospital visits:  None in previous 6 months  Medications: Outpatient Encounter Medications as of 12/19/2020  Medication Sig   amLODipine (NORVASC) 10 MG tablet Take 1 tablet (10 mg total) by mouth daily.   atorvastatin (LIPITOR) 20 MG tablet Take 1 tablet (20 mg total) by mouth daily.   Calcium Carbonate-Vitamin D 600-400 MG-UNIT tablet Take 1 tablet by mouth daily.   estradiol (ESTRACE) 0.5 MG tablet Take 1 tablet (0.5 mg total) by mouth every other day. (Patient taking differently: Take 0.5 mg by mouth. Take one tablet 2 times a week.  Patient is trying to wean off medication)   halobetasol (ULTRAVATE) 0.05 % ointment APPLY TO AFFECTED AREA TWICE A DAY   metoprolol succinate (TOPROL-XL) 50 MG 24 hr tablet Take 1 tablet by mouth once daily with or immediately following a meal   Omega-3 Fatty Acids (FISH OIL) 1200 MG CAPS Take 1 tablet by mouth daily.   omeprazole (PRILOSEC) 40 MG capsule Take 40 mg by mouth daily.   No facility-administered encounter medications on file as of 12/19/2020.   [name] was contacted to remind her of her upcoming telephone visit with [name] on 12/24/2020 at 10:00 AM.  she was reminded to have all medications, supplements and any blood glucose and blood pressure readings available for review at appointment.   Are you having any problems with your medications? The patient did not comment on any problems or concerns at this time   What concerns would you like to discuss with the pharmacist?No    Star Rating Drugs: Medication:  Last Fill: Day Supply Atorvastatin  20mg  10/31/20 90  PCP appointment on 03/08/21 and CCM appointment on 12/24/20 and AWV appointment 03/06/21   [name], CPP notified  Avel Sensor, [place] Assistant [phone]  I have reviewed the care management and care coordination activities outlined in this encounter and I am certifying that I agree with the content of this note. No further action required.  [name], PharmD Clinical Pharmacist [facility] [phone]

## 2020-12-24 ENCOUNTER — Ambulatory Visit (INDEPENDENT_AMBULATORY_CARE_PROVIDER_SITE_OTHER): Payer: PPO

## 2020-12-24 ENCOUNTER — Other Ambulatory Visit: Payer: Self-pay

## 2020-12-24 DIAGNOSIS — E7800 Pure hypercholesterolemia, unspecified: Secondary | ICD-10-CM

## 2020-12-24 DIAGNOSIS — I10 Essential (primary) hypertension: Secondary | ICD-10-CM | POA: Diagnosis not present

## 2020-12-24 NOTE — Progress Notes (Signed)
Chronic Care Management Pharmacy Note  12/24/2020 Name:  Claire Lewis MRN:  [id number] DOB:  04/06/1946   Summary: Home BP 120s/60-70s on amlodipine, metoprolol. LDL 89 on statin. No adherence or cost concerns. She is doing well. Minimal exercise.   Recommendations: Increase activity level as able, goal 15 minutes daily.  Plan: CCM follow up 12 months  Subjective: Claire Lewis is an 75 y.o. year old female who is a primary patient of Bedsole, Amy E, MD.  The CCM team was consulted for assistance with disease management and care coordination needs.    Engaged with patient by telephone for follow up visit in response to provider referral for pharmacy case management and/or care coordination services.   Consent to Services:  The patient was given information about Chronic Care Management services, agreed to services, and gave verbal consent prior to initiation of services.  Please see initial visit note for detailed documentation.   Patient Care Team: Jinny Sanders, MD as PCP - General Levonne Lapping as Consulting Physician (Dentistry) [name], [facility] as Pharmacist (Pharmacist)  Recent office visits:  10/09/20 Orlin Hilding PCP - No medication changes. Continue working on healthy eating, low salt diet and regular exercise.  Follow up 5 months. 09/06/20- Dr. Lorelei Pont - Pt presented for HTN. Increase amlodipine to 10 mg daily.   Recent consult visits:  09/26/20 - Gastroenterology - no data found   Hospital visits:  None in previous 6 months  Objective:  Lab Results  Component Value Date   CREATININE 0.88 02/28/2020   BUN 16 02/28/2020   GFR 62.72 02/28/2020   GFRNONAA 77.81 11/23/2009   NA 141 02/28/2020   K 3.8 02/28/2020   CALCIUM 9.3 02/28/2020   CO2 27 02/28/2020   GLUCOSE 104 (H) 02/28/2020    Lab Results  Component Value Date/Time   HGBA1C 5.8 02/28/2020 07:40 AM   GFR 62.72 02/28/2020 07:40 AM   GFR 64.60 02/01/2019 09:17 AM    Lab Results   Component Value Date   CHOL 171 02/28/2020   HDL 46.50 02/28/2020   LDLCALC 89 02/28/2020   TRIG 176.0 (H) 02/28/2020   CHOLHDL 4 02/28/2020    Hepatic Function Latest Ref Rng & Units 02/28/2020 02/01/2019 01/26/2018  Total Protein 6.0 - 8.3 g/dL 7.0 6.4 6.7  Albumin 3.5 - 5.2 g/dL 4.0 4.0 3.8  AST 0 - 37 U/L 18 16 20   ALT 0 - 35 U/L 17 14 17   Alk Phosphatase 39 - 117 U/L 79 76 67  Total Bilirubin 0.2 - 1.2 mg/dL 0.6 0.6 0.6  Bilirubin, Direct 0.0 - 0.3 mg/dL - - -   Clinical ASCVD: No  The 10-year ASCVD risk score Mikey Bussing DC Jr., et al., 2013) is: 20.4%   Values used to calculate the score:     Age: 4 years     Sex: Female     Is Non-Hispanic African American: No     Diabetic: No     Tobacco smoker: No     Systolic Blood Pressure: 962 mmHg     Is BP treated: Yes     HDL Cholesterol: 46.5 mg/dL     Total Cholesterol: 171 mg/dL    Depression screen [facility] 2/9 02/29/2020 02/08/2019 01/26/2018  Decreased Interest 0 0 0  Down, Depressed, Hopeless 0 0 0  PHQ - 2 Score 0 0 0  Altered sleeping 0 - 0  Tired, decreased energy 0 - 0  Change in appetite 0 - 0  Feeling bad or failure about yourself  0 - 0  Trouble concentrating 0 - 0  Moving slowly or fidgety/restless 0 - 0  Suicidal thoughts 0 - 0  PHQ-9 Score 0 - 0  Difficult doing work/chores Not difficult at all - Not difficult at all    Social History   Tobacco Use  Smoking Status Never  Smokeless Tobacco Never   BP Readings from Last 3 Encounters:  10/09/20 127/77  09/06/20 (!) 164/72  06/15/20 (S) (!) 164/85   Pulse Readings from Last 3 Encounters:  10/09/20 64  09/06/20 64  06/15/20 80   Wt Readings from Last 3 Encounters:  10/09/20 220 lb 8 oz (100 kg)  09/06/20 220 lb 12 oz (100.1 kg)  06/04/20 215 lb (97.5 kg)   BMI Readings from Last 3 Encounters:  10/09/20 36.14 kg/m  09/06/20 36.18 kg/m  06/04/20 35.23 kg/m    Assessment/Interventions: Review of patient past medical history, allergies, medications,  health status, including review of consultants reports, laboratory and other test data, was performed as part of comprehensive evaluation and provision of chronic care management services.   SDOH:  (Social Determinants of Health) assessments and interventions performed: Yes SDOH Interventions    Flowsheet Row Most Recent Value  SDOH Interventions   Financial Strain Interventions Intervention Not Indicated        CCM Care Plan  No Known Allergies  Medications Reviewed Today     Reviewed by Carter Kitten, CMA (Certified Medical Assistant) on 10/09/20 at [place] List Status: <None>   Medication Order Taking? Sig Documenting Provider Last Dose Status Informant  amLODipine (NORVASC) 10 MG tablet [id number]  Take 1 tablet (10 mg total) by mouth daily. Copland, Frederico Hamman, MD  Active   atorvastatin (LIPITOR) 20 MG tablet [id number]  Take 1 tablet (20 mg total) by mouth daily. Jinny Sanders, MD  Active   Calcium Carbonate-Vitamin D 600-400 MG-UNIT tablet [id number]  Take 1 tablet by mouth daily. [provider]  Active   estradiol (ESTRACE) 0.5 MG tablet [id number]  Take 1 tablet (0.5 mg total) by mouth every other day.  Patient taking differently: Take 0.5 mg by mouth. Every three days *weaning off*   Bedsole, Amy E, MD  Active Self  halobetasol (ULTRAVATE) 0.05 % ointment [id number]  APPLY TO AFFECTED AREA TWICE A DAY Bedsole, Amy E, MD  Active   metoprolol succinate (TOPROL-XL) 50 MG 24 hr tablet [id number]  Take 1 tablet by mouth once daily with or immediately following a meal Bedsole, Amy E, MD  Active   Omega-3 Fatty Acids (FISH OIL) 1200 MG CAPS [id number]  Take 1 tablet by mouth daily. [provider]  Active Self  omeprazole (PRILOSEC) 40 MG capsule [id number]  Take 40 mg by mouth daily. [provider]  Active             Patient Active Problem List   Diagnosis Date Noted   Dysuria 06/04/2020   Prediabetes 02/08/2019   Counseling regarding end of life  decision making 11/07/2014   Obesity (BMI 30.0-34.9) 11/07/2014   Rotator cuff impingement syndrome 01/08/2011   PURE HYPERCHOLESTEROLEMIA 05/24/2009   Essential hypertension, benign 01/03/2009   ALLERGIC RHINITIS 01/03/2009   GERD (gastroesophageal reflux disease) 93/79/0240   OTHER LICHEN NOT ELSEWHERE CLASSIFIED 01/03/2009   COLONIC POLYPS, HX OF 01/03/2009    Immunization History  Administered Date(s) Administered   Fluad Quad(high Dose 65+) 02/08/2019, 03/01/2020   Influenza Whole 04/16/2010   Influenza, High  Dose Seasonal PF 06/15/2015, 04/13/2018   PFIZER(Purple Top)SARS-COV-2 Vaccination 07/23/2019, 08/13/2019, 04/13/2020   Pneumococcal Conjugate-13 11/07/2014   Pneumococcal Polysaccharide-23 10/04/2013   Td 06/19/2009   Zoster, Live 05/16/2008    Conditions to be addressed/monitored:  Hypertension and Hyperlipidemia  Patient Care Plan: CCM Pharmacy Care Plan     Problem Identified: CHL AMB "PATIENT-SPECIFIC PROBLEM"      Long-Range Goal: Disease Management   Start Date: 09/04/2020  Priority: High  Note:    Current Barriers:  None identified   Pharmacist Clinical Goal(s):  Patient will adhere to plan to improve blood pressure with lifestyle changes with goal of BP < 140/90  Interventions: 1:1 collaboration with Jinny Sanders, MD regarding development and update of comprehensive plan of care as evidenced by provider attestation and co-signature Inter-disciplinary care team collaboration (see longitudinal plan of care) Comprehensive medication review performed; medication list updated in electronic medical record  Hypertension (BP goal <140/90) -Controlled - per last clinic reading and home readings, amlodipine dose increased 08/2020 -Current treatment: Amlodipine 10 mg - 1 tablet daily Metoprolol succinate 50 mg - 1 tablet daily  -Medications previously tried: none  -Current home readings: running 120s/60-70s, this morning it was 140/72, rechecked after  she took her medicine, 120/68 -Arm cuff, automatic -Current dietary habits: she is trying to limit salt more since last visit -Current exercise habits: minimal  -OTC NSAIDS or cough/cold: Knee pain has improved a lot. She is no longer taking OTC pain medications  -Denies hypotensive/hypertensive symptoms -Counseled to monitor BP at home once weekly, document, and provide log at future appointments -Recommended to continue current medication; Try increasing  activity level.   Hyperlipidemia: (LDL goal < 100) -Controlled -Current treatment: Atorvastatin 20 mg - 1 tablet daily Fish oil - 1 capsule daily  -Medications previously tried: none -Assessed refill history: No gaps for 2022 -Recommended to continue current medication   Patient Goals/Self-Care Activities Patient will:  - engage in lifestyle modification by increasing activity level - try to be active 15 minutes daily  Follow Up Plan: PCP visit September 2022 CCM telephone follow up in 12 months CMA call in 6 months for BP logs     Medication Assistance: None required.  Patient affirms current coverage meets needs.  Star Rating Drugs: Medication:                Last Fill:         Day Supply Atorvastatin 20 mg       10/31/20            90  Patient's preferred pharmacy is:  [id number] [place] [address]Phone: [phone]: [phone] EM[place]([facility] [place]Phone: [phone]: [phone] Pt endorses compliance. Takes all her medications in the morning. Takes pantoprazole 30 minutes before other medications.  Care Plan and Follow Up Patient Decision:  Patient agrees to Care Plan and Follow-up.  MDebbora Dus PharmD Clinical Pharmacist [phone]

## 2020-12-28 NOTE — Patient Instructions (Signed)
Dear Claire Lewis,  Below is a summary of the goals we discussed during our follow up appointment on December 24, 2020. Please contact me anytime with questions or concerns.   Visit Information  Patient Care Plan: CCM Pharmacy Care Plan     Problem Identified: CHL AMB "PATIENT-SPECIFIC PROBLEM"      Long-Range Goal: Disease Management   Start Date: 09/04/2020  Priority: High  Note:    Current Barriers:  None identified   Pharmacist Clinical Goal(s):  Patient will adhere to plan to improve blood pressure with lifestyle changes with goal of BP < 140/90  Interventions: 1:1 collaboration with Jinny Sanders, MD regarding development and update of comprehensive plan of care as evidenced by provider attestation and co-signature Inter-disciplinary care team collaboration (see longitudinal plan of care) Comprehensive medication review performed; medication list updated in electronic medical record  Hypertension (BP goal <140/90) -Controlled - per last clinic reading and home readings, amlodipine dose increased 08/2020 -Current treatment: Amlodipine 10 mg - 1 tablet daily Metoprolol succinate 50 mg - 1 tablet daily  -Medications previously tried: none  -Current home readings: running 120s/60-70s, this morning it was 140/72, rechecked after she took her medicine, 120/68 -Arm cuff, automatic -Current dietary habits: she is trying to limit salt more since last visit -Current exercise habits: minimal  -OTC NSAIDS or cough/cold: Knee pain has improved a lot. She is no longer taking OTC pain medications  -Denies hypotensive/hypertensive symptoms -Counseled to monitor BP at home once weekly, document, and provide log at future appointments -Recommended to continue current medication; Try increasing  activity level.   Hyperlipidemia: (LDL goal < 100) -Controlled -Current treatment: Atorvastatin 20 mg - 1 tablet daily Fish oil - 1 capsule daily  -Medications previously tried:  none -Assessed refill history: No gaps for 2022 -Recommended to continue current medication   Patient Goals/Self-Care Activities Patient will:  - engage in lifestyle modification by increasing activity level - try to be active 15 minutes daily  Follow Up Plan: PCP visit September 2022 CCM telephone follow up in 12 months CMA call in 6 months for BP logs      Patient verbalizes understanding of instructions provided today and agrees to view in [place].   [name], PharmD Clinical Pharmacist [facility] at [facility] [phone]   .

## 2021-02-19 ENCOUNTER — Other Ambulatory Visit: Payer: Self-pay | Admitting: Family Medicine

## 2021-02-19 ENCOUNTER — Other Ambulatory Visit: Payer: Self-pay | Admitting: *Deleted

## 2021-02-19 DIAGNOSIS — Z1231 Encounter for screening mammogram for malignant neoplasm of breast: Secondary | ICD-10-CM

## 2021-02-19 MED ORDER — AMLODIPINE BESYLATE 10 MG PO TABS: 10.0000 mg | ORAL_TABLET | Freq: Every day | ORAL | 0 refills | Status: DC

## 2021-02-19 MED ORDER — ATORVASTATIN CALCIUM 20 MG PO TABS: 20.0000 mg | ORAL_TABLET | Freq: Every day | ORAL | 0 refills | Status: DC

## 2021-02-19 MED ORDER — METOPROLOL SUCCINATE ER 50 MG PO TB24: ORAL_TABLET | ORAL | 0 refills | Status: DC

## 2021-02-22 ENCOUNTER — Telehealth: Payer: Self-pay | Admitting: Family Medicine

## 2021-02-22 DIAGNOSIS — E7800 Pure hypercholesterolemia, unspecified: Secondary | ICD-10-CM

## 2021-02-22 DIAGNOSIS — R7303 Prediabetes: Secondary | ICD-10-CM

## 2021-02-22 NOTE — Telephone Encounter (Signed)
-----   Message from Ellamae Sia sent at 02/11/2021 10:27 AM EDT ----- Regarding: Lab orders for Thursday, 9.15.22 Patient is scheduled for CPX labs, please order future labs, Thanks , Karna Christmas

## 2021-03-01 ENCOUNTER — Other Ambulatory Visit: Payer: PPO

## 2021-03-02 ENCOUNTER — Ambulatory Visit (INDEPENDENT_AMBULATORY_CARE_PROVIDER_SITE_OTHER): Payer: PPO | Admitting: Family Medicine

## 2021-03-02 ENCOUNTER — Other Ambulatory Visit: Payer: Self-pay

## 2021-03-02 VITALS — BP 137/67 | Ht 65.0 in | Wt 205.0 lb

## 2021-03-02 DIAGNOSIS — Z0000 Encounter for general adult medical examination without abnormal findings: Secondary | ICD-10-CM | POA: Diagnosis not present

## 2021-03-02 NOTE — Progress Notes (Addendum)
I reviewed health advisor's note, was available for consultation, and agree with documentation and plan.  Eliezer Lofts, MD [place] [facility]       I connected with [name]  today by telephone and verified that I am speaking with the correct person using two identifiers. Location patient: home Location provider: work Persons participating in the virtual visit: Claire Lewis, [place] discussed the limitations, risks, security and privacy concerns of performing an evaluation and management service by telephone and the availability of in person appointments. I also discussed with the patient that there may be a patient responsible charge related to this service. The patient expressed understanding and verbally consented to this telephonic visit.    Interactive audio and video telecommunications were attempted between this provider and patient, however failed, due to patient having technical difficulties OR patient did not have access to video capability.  We continued and completed visit with audio only.     Vital signs may be patient reported or missing.  Subjective:   Claire Lewis is a 75 y.o. female who presents for Medicare Annual (Subsequent) preventive examination.  Review of Systems     Ms. Bardwell , Thank you for taking time to come for your Medicare Wellness Visit. I appreciate your ongoing commitment to your health goals. Please review the following plan we discussed and let me know if I can assist you in the future.   These are the goals we discussed:  Goals      Cut out extra servings     Pt wishes to stay healthy.     Increase physical activity     Starting 01/26/2018, I will continue to exercise for at least 30 min 3 days a week.       Patient Stated     02/29/2020, I will maintain and continue medications as prescribed.      Pharmacy Care Plan     CARE PLAN ENTRY  Current Barriers:  Chronic Disease Management support,  education, and care coordination needs related to Hypertension and Hyperlipidemia   Hypertension Pharmacist Clinical Goal(s): Over the next 6 months, patient will work with PharmD and providers to achieve BP goal <130/80 mmHg Current regimen:  Amlodipine 5 mg - 1 tablet daily Metoprolol succinate 50 mg - 1 tablet daily with meal Currently checking blood pressure at home less than once per month Interventions: Counseled on blood pressure goals, would like to target lower goal of 130/80 mmHg to reduce cardiovascular risk if tolerated. Discussed potential benefit with alternative blood pressure medications to replace metoprolol.  Patient self care activities - Over the next 6 months, patient will: Check BP 3-4 days this month prior to morning medications, document, and contact pharmacist if blood pressure if above 130/80 mmHg Ensure daily salt intake < 2300 mg/day  Hyperlipidemia Pharmacist Clinical Goal(s): Over the next 6 months, patient will work with PharmD and providers to maintain LDL goal < 100 (most recent LDL: 91) Current regimen:  Atorvastatin 20 mg - 1 tablet daily Omega 3 fatty acid 1200 mg - 1 capsule daily Interventions: Continue current medications Patient self care activities - Over the next 6 months, patient will: Work towards heart healthy exercise goal of 30 minutes of moderate level activity, such as brisk walking, 5 days per week  Additional recommendations: Continue taper off estradiol 0.5 mg. Currently taking 0.5 mg tablet every 3 days. May try cutting tablet in half and taking 0.25 mg every 3  days. Continue to reduce dose as tolerated. Recommend the following vaccinations from your local pharmacy: shingles vaccine (Shingrix 2 dose series for all adult 50+), tetanus vaccine (Tdap - due every 10 years, previous dose 06/19/2009)  Initial goal documentation        This is a list of the screening recommended for you and due dates:  Health Maintenance  Topic Date Due    Zoster (Shingles) Vaccine (1 of 2) Never done   Tetanus Vaccine  02/28/2022*   DEXA scan (bone density measurement)  03/20/2021   Mammogram  03/29/2021   Colon Cancer Screening  04/30/2023   Flu Shot  Completed   COVID-19 Vaccine  Completed   Hepatitis C Screening: USPSTF Recommendation to screen - Ages 18-79 yo.  Completed   HPV Vaccine  Aged Out  *Topic was postponed. The date shown is not the original due date.          Objective:    Today's Vitals   03/02/21 0852  BP: 137/67  Weight: 205 lb (93 kg)  Height: '5\' 5"'$  (1.651 m)  PainSc: 0-No pain   Body mass index is 34.11 kg/m.  Advanced Directives 02/29/2020 01/26/2018 01/14/2017 01/09/2016  Does Patient Have a Medical Advance Directive? No No No No  Would patient like information on creating a medical advance directive? No - Patient declined No - Patient declined No - Patient declined No - patient declined information    Current Medications (verified) Outpatient Encounter Medications as of 03/02/2021  Medication Sig   amLODipine (NORVASC) 10 MG tablet Take 1 tablet (10 mg total) by mouth daily.   atorvastatin (LIPITOR) 20 MG tablet Take 1 tablet (20 mg total) by mouth daily.   Calcium Carbonate-Vitamin D 600-400 MG-UNIT tablet Take 1 tablet by mouth daily.   estradiol (ESTRACE) 0.5 MG tablet Take 1 tablet (0.5 mg total) by mouth every other day. (Patient taking differently: Take 0.5 mg by mouth. Take one tablet 2 times a week.  Patient is trying to wean off medication)   halobetasol (ULTRAVATE) 0.05 % ointment APPLY TO AFFECTED AREA TWICE A DAY   metoprolol succinate (TOPROL-XL) 50 MG 24 hr tablet Take 1 tablet by mouth once daily with or immediately following a meal   Omega-3 Fatty Acids (FISH OIL) 1200 MG CAPS Take 1 tablet by mouth daily.   omeprazole (PRILOSEC) 40 MG capsule Take 40 mg by mouth daily.   No facility-administered encounter medications on file as of 03/02/2021.    Allergies (verified) Patient has no  known allergies.   History: Past Medical History:  Diagnosis Date   Allergy    GERD (gastroesophageal reflux disease)    Hypertension    Past Surgical History:  Procedure Laterality Date   CATARACT EXTRACTION, BILATERAL  04/2019   PARTIAL HYSTERECTOMY  1983   one ovary remains for endometriosis   Family History  Problem Relation Age of Onset   Heart attack Father 16   Coronary artery disease Father    Cancer Mother        breast   Dementia Mother    Breast cancer Mother    Cancer Brother        colon   Cancer Paternal Grandmother        lung   Breast cancer Paternal Grandmother    Meniere's disease Daughter    Social History   Socioeconomic History   Marital status: Married    Spouse name: Not on file   Number of children: Not on  file   Years of education: Not on file   Highest education level: Not on file  Occupational History   Occupation: retired from [employer], operational: UNEMPLOYED  Tobacco Use   Smoking status: Never   Smokeless tobacco: Never  Vaping Use   Vaping Use: Never used  Substance and Sexual Activity   Alcohol use: No   Drug use: No   Sexual activity: Never  Other Topics Concern   Not on file  Social History Narrative   Regular exercise---walks 3-4 times a week   Diet: fruits and veggies, no fried foods, on weight watchers                  Social Determinants of Health   Financial Resource Strain: Low Risk    Difficulty of Paying Living Expenses: Not hard at all  Food Insecurity: No Food Insecurity   Worried About Charity fundraiser in the Last Year: Never true   [place] in the Last Year: Never true  Transportation Needs: No Transportation Needs   Lack of Transportation (Medical): No   Lack of Transportation (Non-Medical): No  Physical Activity: Inactive   Days of Exercise per Week: 0 days   Minutes of Exercise per Session: 0 min  Stress: No Stress Concern Present   Feeling of Stress : Not at all  Social  Connections: Not on file    Tobacco Counseling Counseling given: Not Answered   Clinical Intake:     Pain Score: 0-No pain           Diabetic?No         Activities of Daily Living In your present state of health, do you have any difficulty performing the following activities: 03/02/2021  Hearing? N  Vision? N  Difficulty concentrating or making decisions? N  Walking or climbing stairs? N  Dressing or bathing? N  Doing errands, shopping? N  Some recent data might be hidden    Patient Care Team: Jinny Sanders, MD as PCP - General Levonne Lapping as Consulting Physician (Dentistry) [name], [facility] as Pharmacist (Pharmacist)  Indicate any recent Medical Services you may have received from other than [facility] providers in the past year (date may be approximate).     Assessment:   This is a routine wellness examination for Claire Lewis.  Hearing/Vision screen No results found.  Dietary issues and exercise activities discussed: Exercise limited by: None identified   Goals Addressed             This Visit's Progress    Cut out extra servings       Pt wishes to stay healthy.       Depression Screen PHQ 2/9 Scores 03/02/2021 02/29/2020 02/08/2019 01/26/2018 01/14/2017 01/09/2016 11/07/2014  PHQ - 2 Score 0 0 0 0 0 0 0  PHQ- 9 Score - 0 - 0 - - -    Fall Risk Fall Risk  03/02/2021 02/29/2020 05/09/2019 02/08/2019 01/26/2018  Falls in the past year? 0 0 0 0 No  Comment - - Emmi Telephone Survey: data to providers prior to load - -  Number falls in past yr: 0 0 - - -  Injury with Fall? 0 0 - - -  Risk for fall due to : No Fall Risks Medication side effect - - -  Follow up Falls evaluation completed Falls evaluation completed;Falls prevention discussed - - -    FALL RISK PREVENTION PERTAINING TO THE HOME:  Any  stairs in or around the home? Yes  If so, are there any without handrails? No  Home free of loose throw rugs in walkways, pet beds, electrical cords, etc? Yes   Adequate lighting in your home to reduce risk of falls? Yes   ASSISTIVE DEVICES UTILIZED TO PREVENT FALLS:  Life alert? No  Use of a cane, walker or w/c? No  Grab bars in the bathroom? No  Shower chair or bench in shower? Yes  Elevated toilet seat or a handicapped toilet? No   TIMED UP AND GO:  Was the test performed? No .  Length of time to ambulate 10 feet: n/a sec.   Gait steady and fast with assistive device  Cognitive Function: MMSE - Mini Mental State Exam 02/29/2020 01/26/2018 01/14/2017 01/09/2016  Orientation to time '5 5 5 5  '$ Orientation to Place '5 5 5 5  '$ Registration '3 3 3 3  '$ Attention/ Calculation 5 0 0 0  Recall '3 3 3 3  '$ Language- name 2 objects - 0 0 0  Language- repeat '1 1 1 1  '$ Language- follow 3 step command - '3 3 3  '$ Language- read & follow direction - 0 0 0  Write a sentence - 0 0 0  Copy design - 0 0 0  Total score - '20 20 20     '$ 6CIT Screen 03/02/2021  What Year? 0 points  What month? 0 points  What time? 0 points  Count back from 20 0 points  Months in reverse 2 points  Repeat phrase 2 points  Total Score 4    Immunizations Immunization History  Administered Date(s) Administered   Fluad Quad(high Dose 65+) 02/08/2019, 03/01/2020, 03/08/2021   Influenza Whole 04/16/2010   Influenza, High Dose Seasonal PF 06/15/2015, 04/13/2018   PFIZER Comirnaty(Gray Top)Covid-19 Tri-Sucrose Vaccine 01/24/2021   PFIZER(Purple Top)SARS-COV-2 Vaccination 07/23/2019, 08/13/2019, 04/13/2020, 01/24/2021   Pneumococcal Conjugate-13 11/07/2014   Pneumococcal Polysaccharide-23 10/04/2013   Td 06/19/2009   Zoster, Live 05/16/2008    TDAP status: Up to date  Flu Vaccine status: Due, Education has been provided regarding the importance of this vaccine. Advised may receive this vaccine at local pharmacy or Health Dept. Aware to provide a copy of the vaccination record if obtained from local pharmacy or Health Dept. Verbalized acceptance and understanding.  Pneumococcal  vaccine status: Due, Education has been provided regarding the importance of this vaccine. Advised may receive this vaccine at local pharmacy or Health Dept. Aware to provide a copy of the vaccination record if obtained from local pharmacy or Health Dept. Verbalized acceptance and understanding.  Covid-19 vaccine status: Completed vaccines  Qualifies for Shingles Vaccine? Yes   Zostavax completed No   Shingrix Completed?: No.    Education has been provided regarding the importance of this vaccine. Patient has been advised to call insurance company to determine out of pocket expense if they have not yet received this vaccine. Advised may also receive vaccine at local pharmacy or Health Dept. Verbalized acceptance and understanding.  Screening Tests Health Maintenance  Topic Date Due   Zoster Vaccines- Shingrix (1 of 2) Never done   TETANUS/TDAP  02/28/2022 (Originally 06/20/2019)   DEXA SCAN  03/20/2021   MAMMOGRAM  03/29/2021   COLONOSCOPY (Pts 45-65yr Insurance coverage will need to be confirmed)  04/30/2023   INFLUENZA VACCINE  Completed   COVID-19 Vaccine  Completed   Hepatitis C Screening  Completed   HPV VACCINES  Aged Out    Health Maintenance  Health Maintenance  Due  Topic Date Due   Zoster Vaccines- Shingrix (1 of 2) Never done    Colorectal cancer screening: No longer required.   Mammogram status: Completed 1. Repeat every year  Bone Density status: Completed n/a. Results reflect: Bone density results: NORMAL. Repeat every 1 years.  Lung Cancer Screening: (Low Dose CT Chest recommended if Age 80-80 years, 30 pack-year currently smoking OR have quit w/in 15years.) does not qualify.   Lung Cancer Screening Referral: n/a  Additional Screening:  Hepatitis C Screening: does qualify; Completed n/a  Vision Screening: Recommended annual ophthalmology exams for early detection of glaucoma and other disorders of the eye. Is the patient up to date with their annual eye exam?   Yes  Who is the provider or what is the name of the office in which the patient attends annual eye exams? Luretha Rued If pt is not established with a provider, would they like to be referred to a provider to establish care? No .   Dental Screening: Recommended annual dental exams for proper oral hygiene  Community Resource Referral / Chronic Care Management: CRR required this visit?  No   CCM required this visit?  No      Plan:     I have personally reviewed and noted the following in the patient's chart:   Medical and social history Use of alcohol, tobacco or illicit drugs  Current medications and supplements including opioid prescriptions.  Functional ability and status Nutritional status Physical activity Advanced directives List of other physicians Hospitalizations, surgeries, and ER visits in previous 12 months Vitals Screenings to include cognitive, depression, and falls Referrals and appointments  In addition, I have reviewed and discussed with patient certain preventive protocols, quality metrics, and best practice recommendations. A written personalized care plan for preventive services as well as general preventive health recommendations were provided to patient.     [name], [place]   03/11/2021   Nurse Notes: successful

## 2021-03-06 ENCOUNTER — Ambulatory Visit: Payer: PPO

## 2021-03-06 NOTE — Addendum Note (Signed)
Addended by: Azalee Course T on: 03/06/2021 03:18 PM   Modules accepted: Level of Service

## 2021-03-06 NOTE — Addendum Note (Signed)
Addended by: Azalee Course T on: 03/06/2021 02:12 PM   Modules accepted: Level of Service

## 2021-03-08 ENCOUNTER — Ambulatory Visit (INDEPENDENT_AMBULATORY_CARE_PROVIDER_SITE_OTHER): Payer: PPO | Admitting: Family Medicine

## 2021-03-08 ENCOUNTER — Encounter: Payer: Self-pay | Admitting: Family Medicine

## 2021-03-08 ENCOUNTER — Other Ambulatory Visit: Payer: Self-pay

## 2021-03-08 VITALS — BP 140/60 | HR 60 | Temp 97.9°F | Ht 65.25 in | Wt 214.4 lb

## 2021-03-08 DIAGNOSIS — I10 Essential (primary) hypertension: Secondary | ICD-10-CM

## 2021-03-08 DIAGNOSIS — Z23 Encounter for immunization: Secondary | ICD-10-CM

## 2021-03-08 DIAGNOSIS — Z0000 Encounter for general adult medical examination without abnormal findings: Secondary | ICD-10-CM

## 2021-03-08 DIAGNOSIS — R7303 Prediabetes: Secondary | ICD-10-CM

## 2021-03-08 DIAGNOSIS — E7800 Pure hypercholesterolemia, unspecified: Secondary | ICD-10-CM | POA: Diagnosis not present

## 2021-03-08 LAB — COMPREHENSIVE METABOLIC PANEL
ALT: 15 U/L (ref 0–35)
AST: 18 U/L (ref 0–37)
Albumin: 4.1 g/dL (ref 3.5–5.2)
Alkaline Phosphatase: 89 U/L (ref 39–117)
BUN: 18 mg/dL (ref 6–23)
CO2: 27 mEq/L (ref 19–32)
Calcium: 9.4 mg/dL (ref 8.4–10.5)
Chloride: 106 mEq/L (ref 96–112)
Creatinine, Ser: 0.81 mg/dL (ref 0.40–1.20)
GFR: 70.95 mL/min (ref >60.00)
Glucose, Bld: 99 mg/dL (ref 70–99)
Potassium: 4.4 mEq/L (ref 3.5–5.1)
Sodium: 139 mEq/L (ref 135–145)
Total Bilirubin: 0.6 mg/dL (ref 0.2–1.2)
Total Protein: 6.8 g/dL (ref 6.0–8.3)

## 2021-03-08 LAB — LIPID PANEL
Cholesterol: 162 mg/dL (ref 0–200)
HDL: 46.5 mg/dL (ref >39.00)
LDL Cholesterol: 90 mg/dL (ref 0–99)
NonHDL: 115.86
Total CHOL/HDL Ratio: 3
Triglycerides: 127 mg/dL (ref 0.0–149.0)
VLDL: 25.4 mg/dL (ref 0.0–40.0)

## 2021-03-08 LAB — HEMOGLOBIN A1C: Hgb A1c MFr Bld: 5.8 % (ref 4.6–6.5)

## 2021-03-08 NOTE — Patient Instructions (Addendum)
Please stop at the lab to have labs drawn.   Continue weaning off of estrogen.  Start exercise 3 days a week.

## 2021-03-08 NOTE — Assessment & Plan Note (Addendum)
Chronic.Marland Kitchen due for re-eval.   tolerating lipitor 20 mg daily

## 2021-03-08 NOTE — Assessment & Plan Note (Signed)
Chronic.Marland Kitchen due for re-eval.

## 2021-03-08 NOTE — Progress Notes (Signed)
Patient ID: Claire Lewis, female    DOB: 1945/10/01, 75 y.o.   MRN: [id number]  This visit was conducted in person.  BP 140/60   Pulse 60   Temp 97.9 F (36.6 C) (Temporal)   Ht 5' 5.25" (1.657 m)   Wt 214 lb 6 oz (97.2 kg)   SpO2 98%   BMI 35.40 kg/m    CC: Chief Complaint  Patient presents with   Annual Exam    Part 2     Subjective:   HPI: Claire Lewis is a 75 y.o. female presenting on 03/08/2021 for Annual Exam (Part 2/)    The patient presents for complete physical and review of chronic health problems. He/She also has the following acute concerns today: none  The patient saw a LPN or RN for medicare wellness visit.  Prevention and wellness was reviewed in detail. Note reviewed and important notes copied below.  Elevated Cholesterol:  Due for re-eval. Using medications without problems: no SE to atorvastatin. Muscle aches:  Diet compliance: healthy Exercise: minimal Other complaints:   Prediabetes  due for re-eval.  Hypertension:   At home well controlled BP Readings from Last 3 Encounters:  03/08/21 140/60  03/02/21 137/67  10/09/20 127/77  Using medication without problems or lightheadedness:  none Chest pain with exertion: none Edema:none Short of breath: none Average home BPs: 120-130/68-70 Other issues:  She is trying to wean off estradiol.. takes a bout 2 days a week.      Relevant past medical, surgical, family and social history reviewed and updated as indicated. Interim medical history since our last visit reviewed. Allergies and medications reviewed and updated. Outpatient Medications Prior to Visit  Medication Sig Dispense Refill   amLODipine (NORVASC) 10 MG tablet Take 1 tablet (10 mg total) by mouth daily. 90 tablet 0   atorvastatin (LIPITOR) 20 MG tablet Take 1 tablet (20 mg total) by mouth daily. 90 tablet 0   Calcium Carbonate-Vitamin D 600-400 MG-UNIT tablet Take 1 tablet by mouth daily.     estradiol (ESTRACE) 0.5 MG  tablet Take 1 tablet (0.5 mg total) by mouth every other day. (Patient taking differently: Take 0.5 mg by mouth. Take one tablet 2 times a week.  Patient is trying to wean off medication) 45 tablet 3   halobetasol (ULTRAVATE) 0.05 % ointment APPLY TO AFFECTED AREA TWICE A DAY 15 g 0   metoprolol succinate (TOPROL-XL) 50 MG 24 hr tablet Take 1 tablet by mouth once daily with or immediately following a meal 90 tablet 0   Omega-3 Fatty Acids (FISH OIL) 1200 MG CAPS Take 1 tablet by mouth daily.     omeprazole (PRILOSEC) 40 MG capsule Take 40 mg by mouth daily.     No facility-administered medications prior to visit.     Per HPI unless specifically indicated in ROS section below Review of Systems  Constitutional:  Negative for fatigue and fever.  HENT:  Negative for ear pain.   Eyes:  Negative for pain.  Respiratory:  Negative for chest tightness and shortness of breath.   Cardiovascular:  Negative for chest pain, palpitations and leg swelling.  Gastrointestinal:  Negative for abdominal pain.  Genitourinary:  Negative for dysuria.  Objective:  BP 140/60   Pulse 60   Temp 97.9 F (36.6 C) (Temporal)   Ht 5' 5.25" (1.657 m)   Wt 214 lb 6 oz (97.2 kg)   SpO2 98%   BMI 35.40 kg/m   Wt Readings  from Last 3 Encounters:  03/08/21 214 lb 6 oz (97.2 kg)  03/02/21 205 lb (93 kg)  10/09/20 220 lb 8 oz (100 kg)      Physical Exam Vitals and nursing note reviewed.  Constitutional:      General: She is not in acute distress.    Appearance: Normal appearance. She is well-developed. She is not ill-appearing or toxic-appearing.  HENT:     Head: Normocephalic.     Right Ear: Hearing, tympanic membrane, ear canal and external ear normal.     Left Ear: Hearing, tympanic membrane, ear canal and external ear normal.     Nose: Nose normal.  Eyes:     General: Lids are normal. Lids are everted, no foreign bodies appreciated.     Conjunctiva/sclera: Conjunctivae normal.     Pupils: Pupils are  equal, round, and reactive to light.  Neck:     Thyroid: No thyroid mass or thyromegaly.     Vascular: No carotid bruit.     Trachea: Trachea normal.  Cardiovascular:     Rate and Rhythm: Normal rate and regular rhythm.     Heart sounds: Normal heart sounds, S1 normal and S2 normal. No murmur heard.   No gallop.  Pulmonary:     Effort: Pulmonary effort is normal. No respiratory distress.     Breath sounds: Normal breath sounds. No wheezing, rhonchi or rales.  Abdominal:     General: Bowel sounds are normal. There is no distension or abdominal bruit.     Palpations: Abdomen is soft. There is no fluid wave or mass.     Tenderness: There is no abdominal tenderness. There is no guarding or rebound.     Hernia: No hernia is present.  Musculoskeletal:     Cervical back: Normal range of motion and neck supple.  Lymphadenopathy:     Cervical: No cervical adenopathy.  Skin:    General: Skin is warm and dry.     Findings: No rash.  Neurological:     Mental Status: She is alert.     Cranial Nerves: No cranial nerve deficit.     Sensory: No sensory deficit.  Psychiatric:        Mood and Affect: Mood is not anxious or depressed.        Speech: Speech normal.        Behavior: Behavior normal. Behavior is cooperative.        Judgment: Judgment normal.      Results for orders placed or performed during the hospital encounter of 06/15/20  Urine Culture   Specimen: Urine, Clean Catch  Result Value Ref Range   Specimen Description URINE, CLEAN CATCH    Special Requests      Normal Performed at [facility], [address] [place], [place] [address]    Culture >=100,000 COLONIES/mL ESCHERICHIA COLI (A)    Report Status 06/18/2020 FINAL    Organism ID, Bacteria ESCHERICHIA COLI (A)       Susceptibility   Escherichia coli - MIC*    AMPICILLIN <=2 SENSITIVE Sensitive     CEFAZOLIN <=4 SENSITIVE Sensitive     CEFEPIME <=0.12 SENSITIVE Sensitive     CEFTRIAXONE <=0.25 SENSITIVE  Sensitive     CIPROFLOXACIN <=0.25 SENSITIVE Sensitive     GENTAMICIN <=1 SENSITIVE Sensitive     IMIPENEM <=0.25 SENSITIVE Sensitive     NITROFURANTOIN <=16 SENSITIVE Sensitive     TRIMETH/SULFA <=20 SENSITIVE Sensitive     AMPICILLIN/SULBACTAM <=2 SENSITIVE  Sensitive     PIP/TAZO <=4 SENSITIVE Sensitive     * >=100,000 COLONIES/mL ESCHERICHIA COLI  POCT urinalysis dipstick  Result Value Ref Range   Color, UA yellow yellow   Clarity, UA cloudy (A) clear   Glucose, UA negative negative mg/dL   Bilirubin, UA negative negative   Ketones, POC UA negative negative mg/dL   Spec Grav, UA <=1.005 (A) 1.010 - 1.025   Blood, UA large (A) negative   pH, UA 5.0 5.0 - 8.0   Protein Ur, POC trace (A) negative mg/dL   Urobilinogen, UA 0.2 0.2 or 1.0 E.U./dL   Nitrite, UA Positive (A) Negative   Leukocytes, UA Small (1+) (A) Negative    This visit occurred during the SARS-CoV-2 public health emergency.  Safety protocols were in place, including screening questions prior to the visit, additional usage of staff PPE, and extensive cleaning of exam room while observing appropriate contact time as indicated for disinfecting solutions.   COVID 19 screen:  No recent travel or known exposure to COVID19 The patient denies respiratory symptoms of COVID 19 at this time. The importance of social distancing was discussed today.   Assessment and Plan   The patient's preventative maintenance and recommended screening tests for an annual wellness exam were reviewed in full today. Brought up to date unless services declined.  Counselled on the importance of diet, exercise, and its role in overall health and mortality. The patient's FH and SH was reviewed, including their home life, tobacco status, and drug and alcohol status.    Vaccines: Uptodate with PNA,consider shingles, given high dose flu today. Refused td. S/P COVID19 vaccine series x 4. DEXA: stable, normal 03/2016, On HRT.   repeat in 5 years..  plan repeat in 2023 Mammogram: scheduled 03/2021, MGM, mother with breast cancer. DVE/PAP : TAH but has one ovary...  No symptoms, no family histroy of ovarian cancer.    No need for DVE, pt agreeable Colon: 2019 polyps, family history colon cancer brother, repeat in 41 years, Dr. Benson [place].  Nonsmoker.  Hep c: done  Problem List Items Addressed This Visit     Essential hypertension, benign    Stable, chronic.  Continue current medication.   Amlodipine 10 mg daily toprol xl 50 mg daily      Prediabetes    Chronic.Marland Kitchen due for re-eval.      PURE HYPERCHOLESTEROLEMIA    Chronic.Marland Kitchen due for re-eval.   tolerating lipitor 20 mg daily      Other Visit Diagnoses     Medicare annual wellness visit, subsequent    -  Primary   Need for influenza vaccination       Relevant Orders   Flu Vaccine QUAD High Dose(Fluad) (Completed)        Eliezer Lofts, MD

## 2021-03-08 NOTE — Assessment & Plan Note (Signed)
Stable, chronic.  Continue current medication.   Amlodipine 10 mg daily toprol xl 50 mg daily

## 2021-03-11 NOTE — Patient Instructions (Signed)
  Claire Lewis , Thank you for taking time to come for your Medicare Wellness Visit. I appreciate your ongoing commitment to your health goals. Please review the following plan we discussed and let me know if I can assist you in the future.   These are the goals we discussed:  Goals      Cut out extra servings     Pt wishes to stay healthy.     Increase physical activity     Starting 01/26/2018, I will continue to exercise for at least 30 min 3 days a week.       Patient Stated     02/29/2020, I will maintain and continue medications as prescribed.      Pharmacy Care Plan     CARE PLAN ENTRY  Current Barriers:  Chronic Disease Management support, education, and care coordination needs related to Hypertension and Hyperlipidemia   Hypertension Pharmacist Clinical Goal(s): Over the next 6 months, patient will work with PharmD and providers to achieve BP goal <130/80 mmHg Current regimen:  Amlodipine 5 mg - 1 tablet daily Metoprolol succinate 50 mg - 1 tablet daily with meal Currently checking blood pressure at home less than once per month Interventions: Counseled on blood pressure goals, would like to target lower goal of 130/80 mmHg to reduce cardiovascular risk if tolerated. Discussed potential benefit with alternative blood pressure medications to replace metoprolol.  Patient self care activities - Over the next 6 months, patient will: Check BP 3-4 days this month prior to morning medications, document, and contact pharmacist if blood pressure if above 130/80 mmHg Ensure daily salt intake < 2300 mg/day  Hyperlipidemia Pharmacist Clinical Goal(s): Over the next 6 months, patient will work with PharmD and providers to maintain LDL goal < 100 (most recent LDL: 91) Current regimen:  Atorvastatin 20 mg - 1 tablet daily Omega 3 fatty acid 1200 mg - 1 capsule daily Interventions: Continue current medications Patient self care activities - Over the next 6 months, patient will: Work  towards heart healthy exercise goal of 30 minutes of moderate level activity, such as brisk walking, 5 days per week  Additional recommendations: Continue taper off estradiol 0.5 mg. Currently taking 0.5 mg tablet every 3 days. May try cutting tablet in half and taking 0.25 mg every 3 days. Continue to reduce dose as tolerated. Recommend the following vaccinations from your local pharmacy: shingles vaccine (Shingrix 2 dose series for all adult 50+), tetanus vaccine (Tdap - due every 10 years, previous dose 06/19/2009)  Initial goal documentation        This is a list of the screening recommended for you and due dates:  Health Maintenance  Topic Date Due   Zoster (Shingles) Vaccine (1 of 2) Never done   Tetanus Vaccine  02/28/2022*   DEXA scan (bone density measurement)  03/20/2021   Mammogram  03/29/2021   Colon Cancer Screening  04/30/2023   Flu Shot  Completed   COVID-19 Vaccine  Completed   Hepatitis C Screening: USPSTF Recommendation to screen - Ages 18-79 yo.  Completed   HPV Vaccine  Aged Out  *Topic was postponed. The date shown is not the original due date.

## 2021-04-02 ENCOUNTER — Other Ambulatory Visit: Payer: Self-pay

## 2021-04-02 ENCOUNTER — Ambulatory Visit
Admission: RE | Admit: 2021-04-02 | Discharge: 2021-04-02 | Disposition: A | Discharge status: Home / Self Care | Payer: PPO | Source: Ambulatory Visit | Attending: Family Medicine | Admitting: Family Medicine

## 2021-04-02 DIAGNOSIS — Z1231 Encounter for screening mammogram for malignant neoplasm of breast: Secondary | ICD-10-CM | POA: Diagnosis not present

## 2021-05-01 ENCOUNTER — Telehealth: Payer: Self-pay

## 2021-05-01 NOTE — Progress Notes (Addendum)
Chronic Care Management Pharmacy Assistant   Name: Claire Lewis  MRN: [id number] DOB: Dec 14, 1945  Reason for Encounter: CCM (Hypertension Disease State)   Recent office visits:  03/08/2021 - Eliezer Lofts, MD - Patient presented for annual exam. Labs: CMP, A1c, and Lipid. No medication changes.  03/02/2021 - Eliezer Lofts, MD - Patient presented for Annual Wellness Visit. No medication changes.   Recent consult visits:  None since last CCM contact  Hospital visits:  None in previous 6 months  Medications: Outpatient Encounter Medications as of 05/01/2021  Medication Sig   amLODipine (NORVASC) 10 MG tablet Take 1 tablet (10 mg total) by mouth daily.   atorvastatin (LIPITOR) 20 MG tablet Take 1 tablet (20 mg total) by mouth daily.   Calcium Carbonate-Vitamin D 600-400 MG-UNIT tablet Take 1 tablet by mouth daily.   estradiol (ESTRACE) 0.5 MG tablet Take 1 tablet (0.5 mg total) by mouth every other day. (Patient taking differently: Take 0.5 mg by mouth. Take one tablet 2 times a week.  Patient is trying to wean off medication)   halobetasol (ULTRAVATE) 0.05 % ointment APPLY TO AFFECTED AREA TWICE A DAY   metoprolol succinate (TOPROL-XL) 50 MG 24 hr tablet Take 1 tablet by mouth once daily with or immediately following a meal   Omega-3 Fatty Acids (FISH OIL) 1200 MG CAPS Take 1 tablet by mouth daily.   omeprazole (PRILOSEC) 40 MG capsule Take 40 mg by mouth daily.   No facility-administered encounter medications on file as of 05/01/2021.    Recent Office Vitals: BP Readings from Last 3 Encounters:  03/08/21 140/60  03/02/21 137/67  10/09/20 127/77   Pulse Readings from Last 3 Encounters:  03/08/21 60  10/09/20 64  09/06/20 64    Wt Readings from Last 3 Encounters:  03/08/21 214 lb 6 oz (97.2 kg)  03/02/21 205 lb (93 kg)  10/09/20 220 lb 8 oz (100 kg)     Kidney Function Lab Results  Component Value Date/Time   CREATININE 0.81 03/08/2021 10:09 AM   CREATININE 0.88  02/28/2020 07:40 AM   GFR 70.95 03/08/2021 10:09 AM   GFRNONAA 77.81 11/23/2009 09:00 AM    BMP Latest Ref Rng & Units 03/08/2021 02/28/2020 02/01/2019  Glucose 70 - 99 mg/dL 99 104(H) 105(H)  BUN 6 - 23 mg/dL 18 16 18   Creatinine 0.40 - 1.20 mg/dL 0.81 0.88 0.86  Sodium 135 - 145 mEq/L 139 141 141  Potassium 3.5 - 5.1 mEq/L 4.4 3.8 4.0  Chloride 96 - 112 mEq/L 106 105 106  CO2 19 - 32 mEq/L 27 27 28   Calcium 8.4 - 10.5 mg/dL 9.4 9.3 9.1   Contacted patient on 05/01/2021 to discuss hypertension disease state  Current antihypertensive regimen:  Amlodipine 10 mg daily - Take 1 tablet by mouth daily.  Toprol xl 50 mg daily - Take 1 tablet by mouth ounce daily with or immediately following a meal.   Patient verbally confirms she is taking the above medications as directed. Yes  How often are you checking your Blood Pressure? weekly  Current home BP readings: Patient did not have any readings. She is going to take her BP for the next three days and I will call back on 11/21 to get her log.   Wrist or arm cuff: Arm  Caffeine intake: Tea - Drinks 5 cups a day Salt intake: Patient states she does not watch her salt intake.  OTC medications including pseudoephedrine or NSAIDs? Ibuprofen as need -  several times a week.   Any readings above 180/120? No  What recent interventions/DTPs have been made by any provider to improve Blood Pressure control since last CPP Visit: Start exercise 3 days a week.  Any recent hospitalizations or ED visits since last visit with CPP? No  What diet changes have been made to improve Blood Pressure Control?  Patient has not made any diet changes.   What exercise is being done to improve your Blood Pressure Control?  Patient does not exercise.   Adherence Review: Is the patient currently on ACE/ARB medication? No Does the patient have >5 day gap between last estimated fill dates? No  Star Rating Drugs:  Medication:  Last Fill: Day Supply Atorvastatin 20  mg 02/21/2021 90   Care Gaps: Annual wellness visit in last year? Yes 03/08/2021 Most Recent BP reading: 140/60 on 03/08/2021  No appointments scheduled within the next 30 days.  [name], CPP notified  [name], [place] Clinical Pharmacy Assistant [phone]  Time Spent:  25 Minutes I have reviewed the care management and care coordination activities outlined in this encounter and I am certifying that I agree with the content of this note. No further action required.  [name], PharmD Clinical Pharmacist [facility] at [facility] [phone]

## 2021-06-03 ENCOUNTER — Other Ambulatory Visit: Payer: Self-pay

## 2021-06-03 ENCOUNTER — Telehealth: Payer: Self-pay

## 2021-06-03 DIAGNOSIS — I10 Essential (primary) hypertension: Secondary | ICD-10-CM

## 2021-06-03 DIAGNOSIS — E7800 Pure hypercholesterolemia, unspecified: Secondary | ICD-10-CM

## 2021-06-03 MED ORDER — AMLODIPINE BESYLATE 10 MG PO TABS: 10.0000 mg | ORAL_TABLET | Freq: Every day | ORAL | 3 refills | Status: DC

## 2021-06-03 MED ORDER — ATORVASTATIN CALCIUM 20 MG PO TABS: 20.0000 mg | ORAL_TABLET | Freq: Every day | ORAL | 3 refills | Status: DC

## 2021-06-03 MED ORDER — METOPROLOL SUCCINATE ER 50 MG PO TB24: ORAL_TABLET | ORAL | 3 refills | Status: DC

## 2021-06-03 NOTE — Progress Notes (Addendum)
Chronic Care Management Pharmacy Assistant   Name: Claire Lewis  MRN: [id number] DOB: Jun 05, 1946  Reason for Encounter: CCM (Hypertension Disease State)   Recent office visits:  None since last CCM contact  Recent consult visits:  None since last CCM contact  Hospital visits:  None in previous 6 months  Medications: Outpatient Encounter Medications as of 06/03/2021  Medication Sig   amLODipine (NORVASC) 10 MG tablet Take 1 tablet (10 mg total) by mouth daily.   atorvastatin (LIPITOR) 20 MG tablet Take 1 tablet (20 mg total) by mouth daily.   Calcium Carbonate-Vitamin D 600-400 MG-UNIT tablet Take 1 tablet by mouth daily.   estradiol (ESTRACE) 0.5 MG tablet Take 1 tablet (0.5 mg total) by mouth every other day. (Patient taking differently: Take 0.5 mg by mouth. Take one tablet 2 times a week.  Patient is trying to wean off medication)   halobetasol (ULTRAVATE) 0.05 % ointment APPLY TO AFFECTED AREA TWICE A DAY   metoprolol succinate (TOPROL-XL) 50 MG 24 hr tablet Take 1 tablet by mouth once daily with or immediately following a meal   Omega-3 Fatty Acids (FISH OIL) 1200 MG CAPS Take 1 tablet by mouth daily.   omeprazole (PRILOSEC) 40 MG capsule Take 40 mg by mouth daily.   No facility-administered encounter medications on file as of 06/03/2021.   Recent Office Vitals: BP Readings from Last 3 Encounters:  03/08/21 140/60  03/02/21 137/67  10/09/20 127/77   Pulse Readings from Last 3 Encounters:  03/08/21 60  10/09/20 64  09/06/20 64    Wt Readings from Last 3 Encounters:  03/08/21 214 lb 6 oz (97.2 kg)  03/02/21 205 lb (93 kg)  10/09/20 220 lb 8 oz (100 kg)    Kidney Function Lab Results  Component Value Date/Time   CREATININE 0.81 03/08/2021 10:09 AM   CREATININE 0.88 02/28/2020 07:40 AM   GFR 70.95 03/08/2021 10:09 AM   GFRNONAA 77.81 11/23/2009 09:00 AM   BMP Latest Ref Rng & Units 03/08/2021 02/28/2020 02/01/2019  Glucose 70 - 99 mg/dL 99 104(H) 105(H)   BUN 6 - 23 mg/dL 18 16 18   Creatinine 0.40 - 1.20 mg/dL 0.81 0.88 0.86  Sodium 135 - 145 mEq/L 139 141 141  Potassium 3.5 - 5.1 mEq/L 4.4 3.8 4.0  Chloride 96 - 112 mEq/L 106 105 106  CO2 19 - 32 mEq/L 27 27 28   Calcium 8.4 - 10.5 mg/dL 9.4 9.3 9.1   Contacted patient on 06/03/2021 to discuss hypertension disease state  Current antihypertensive regimen:  Amlodipine 10 mg daily - Take 1 tablet by mouth daily.  Toprol xl 50 mg daily - Take 1 tablet by mouth ounce daily with or immediately following a meal.   Patient verbally confirms she is taking the above medications as directed. Yes  How often are you checking your Blood Pressure? Infrequently - Patient took her blood pressure while I was on the phone with her. Reading was: 138/69 - Patient had unsweet caffeinated tea before taking blood pressure.   Wrist or arm cuff: Arm Caffeine intake: Patient drinks tea daily Salt intake: Does not watch salt intake OTC medications including pseudoephedrine or NSAIDs? Ibuprofen as need - several times a week.   Any readings above 180/120? No  What recent interventions/DTPs have been made by any provider to improve Blood Pressure control since last CPP Visit: No recent interventions.   Any recent hospitalizations or ED visits since last visit with CPP? No  What diet  changes have been made to improve Blood Pressure Control?  No diet changes have been made.   What exercise is being done to improve your Blood Pressure Control?  Patient does not exercise.   Adherence Review: Is the patient currently on ACE/ARB medication? No Does the patient have >5 day gap between last estimated fill dates? No   Star Rating Drugs:  Medication:                Last Fill:         Day Supply Atorvastatin 20 mg      02/21/2021      90         Fill date verified with [facility] - Patient called Saturday to get a refill. Pharmacy states there was no refills. Please have Dr. Diona Browner send a refill  request to [facility] for Atorvastatin 20 mg.   Care Gaps: Annual wellness visit in last year? Yes 03/08/2021 Most Recent BP reading: 140/60 on 03/08/2021  No appointments scheduled within the next 30 days.  [name], CPP notified  [name], [place] Clinical Pharmacy Assistant [phone]  I have reviewed the care management and care coordination activities outlined in this encounter and I am certifying that I agree with the content of this note. Sent in refills.  [name], PharmD Clinical Pharmacist [facility] at [facility] [phone]

## 2021-06-03 NOTE — Telephone Encounter (Signed)
Patient reported need for refills. Recent AWV, no med changes. Refilled maintenance prescriptions for 1 year

## 2021-06-27 ENCOUNTER — Telehealth: Payer: Self-pay

## 2021-06-27 NOTE — Progress Notes (Addendum)
Chronic Care Management Pharmacy Assistant   Name: Claire Lewis  MRN: [id number] DOB: 1945/10/12  Reason for Encounter: CCM (Hypertension Disease State)   Recent office visits:  None since last CCM contact  Recent consult visits:  None since last CCM contact  Hospital visits:  None in previous 6 months  Medications: Outpatient Encounter Medications as of 06/27/2021  Medication Sig   amLODipine (NORVASC) 10 MG tablet Take 1 tablet (10 mg total) by mouth daily.   atorvastatin (LIPITOR) 20 MG tablet Take 1 tablet (20 mg total) by mouth daily.   Calcium Carbonate-Vitamin D 600-400 MG-UNIT tablet Take 1 tablet by mouth daily.   estradiol (ESTRACE) 0.5 MG tablet Take 1 tablet (0.5 mg total) by mouth every other day. (Patient taking differently: Take 0.5 mg by mouth. Take one tablet 2 times a week.  Patient is trying to wean off medication)   halobetasol (ULTRAVATE) 0.05 % ointment APPLY TO AFFECTED AREA TWICE A DAY   metoprolol succinate (TOPROL-XL) 50 MG 24 hr tablet Take 1 tablet by mouth once daily with or immediately following a meal   Omega-3 Fatty Acids (FISH OIL) 1200 MG CAPS Take 1 tablet by mouth daily.   omeprazole (PRILOSEC) 40 MG capsule Take 40 mg by mouth daily.   No facility-administered encounter medications on file as of 06/27/2021.   Recent Office Vitals: BP Readings from Last 3 Encounters:  03/08/21 140/60  03/02/21 137/67  10/09/20 127/77   Pulse Readings from Last 3 Encounters:  03/08/21 60  10/09/20 64  09/06/20 64    Wt Readings from Last 3 Encounters:  03/08/21 214 lb 6 oz (97.2 kg)  03/02/21 205 lb (93 kg)  10/09/20 220 lb 8 oz (100 kg)    Kidney Function Lab Results  Component Value Date/Time   CREATININE 0.81 03/08/2021 10:09 AM   CREATININE 0.88 02/28/2020 07:40 AM   GFR 70.95 03/08/2021 10:09 AM   GFRNONAA 77.81 11/23/2009 09:00 AM   BMP Latest Ref Rng & Units 03/08/2021 02/28/2020 02/01/2019  Glucose 70 - 99 mg/dL 99 104(H) 105(H)  BUN  6 - 23 mg/dL 18 16 18   Creatinine 0.40 - 1.20 mg/dL 0.81 0.88 0.86  Sodium 135 - 145 mEq/L 139 141 141  Potassium 3.5 - 5.1 mEq/L 4.4 3.8 4.0  Chloride 96 - 112 mEq/L 106 105 106  CO2 19 - 32 mEq/L 27 27 28   Calcium 8.4 - 10.5 mg/dL 9.4 9.3 9.1   Contacted patient on 06/27/2021 to discuss hypertension disease state  Current antihypertensive regimen:  Amlodipine 10 mg daily - Take 1 tablet by mouth daily.  Toprol xl 50 mg daily - Take 1 tablet by mouth ounce daily with or immediately following a meal.   Patient verbally confirms she is taking the above medications as directed. Yes  How often are you checking your Blood Pressure? weekly  she checks her blood pressure in the morning before taking her medication. Patient took her blood pressure yesterday 06/26/2021 and it was 136/68.   Any readings above 180/120? No  Wrist or arm cuff: Arm Caffeine intake: Patient drinks tea daily Salt intake: Does not watch salt intake OTC medications including pseudoephedrine or NSAIDs? Patient takes Ibuprofen several times a week.    Any readings above 180/120? No   What recent interventions/DTPs have been made by any provider to improve Blood Pressure control since last CPP Visit: No recent interventions.    Any recent hospitalizations or ED visits since last visit with  CPP? No    What diet changes have been made to improve Blood Pressure Control? No diet changes have been made.   What exercise is being done to improve your Blood Pressure Control?  Patient does not exercise.    Adherence Review: Is the patient currently on ACE/ARB medication? No Does the patient have >5 day gap between last estimated fill dates? No   Star Rating Drugs:  Medication:                Last Fill:         Day Supply Atorvastatin 20 mg      12/202022      90          Care Gaps: Annual wellness visit in last year? Yes 03/08/2021 Most Recent BP reading: 140/60 on 03/08/2021   Upcoming appointments: No  appointments scheduled within the next 30 days.  [name], CPP notified  [name], [place] Clinical Pharmacy Assistant [phone]  I have reviewed the care management and care coordination activities outlined in this encounter and I am certifying that I agree with the content of this note. No further action required.  [name], PharmD Clinical Pharmacist [facility] at [facility] [phone]

## 2021-07-04 DIAGNOSIS — H40013 Open angle with borderline findings, low risk, bilateral: Secondary | ICD-10-CM | POA: Diagnosis not present

## 2021-08-06 DIAGNOSIS — H401131 Primary open-angle glaucoma, bilateral, mild stage: Secondary | ICD-10-CM | POA: Diagnosis not present

## 2021-08-29 ENCOUNTER — Other Ambulatory Visit: Payer: Self-pay | Admitting: Family Medicine

## 2021-08-30 NOTE — Telephone Encounter (Signed)
Is this okay to refill? 

## 2021-10-09 ENCOUNTER — Encounter: Payer: Self-pay | Admitting: Orthopaedic Surgery

## 2021-10-09 ENCOUNTER — Ambulatory Visit: Payer: Self-pay

## 2021-10-09 ENCOUNTER — Ambulatory Visit: Payer: PPO | Admitting: Orthopaedic Surgery

## 2021-10-09 ENCOUNTER — Ambulatory Visit (INDEPENDENT_AMBULATORY_CARE_PROVIDER_SITE_OTHER): Payer: PPO

## 2021-10-09 DIAGNOSIS — M654 Radial styloid tenosynovitis [de Quervain]: Secondary | ICD-10-CM | POA: Diagnosis not present

## 2021-10-09 DIAGNOSIS — M79644 Pain in right finger(s): Secondary | ICD-10-CM

## 2021-10-09 MED ORDER — LIDOCAINE HCL 1 % IJ SOLN
1.0000 mL | INTRAMUSCULAR | Status: AC | PRN
  Administered 2021-10-09: 1 mL

## 2021-10-09 MED ORDER — METHYLPREDNISOLONE ACETATE 40 MG/ML IJ SUSP
40.0000 mg | INTRAMUSCULAR | Status: AC | PRN
  Administered 2021-10-09: 40 mg

## 2021-10-09 MED ORDER — METHYLPREDNISOLONE 4 MG PO TABS: ORAL_TABLET | ORAL | 0 refills | Status: DC

## 2021-10-09 NOTE — Progress Notes (Signed)
? ?Office Visit Note ?  ?Patient: Claire Lewis           ?Date of Birth: [birth year]           ?MRN: [id number] ?Visit Date: 10/09/2021 ?             ?Requested by: Jinny Sanders, MD ?[address] ?[place],  [place] [id number] ?PCP: Jinny Sanders, MD ? ? ?Assessment & Plan: ?Visit Diagnoses:  ?1. Pain of right thumb   ?2. De Quervain's tenosynovitis, right   ? ? ?Plan: Her signs and symptoms are consistent with both de Quervain's tenosynovitis of the right side as well as basilar thumb joint arthritis.  I did provide a steroid injection around the radial aspect of her wrist to see if this would help.  I will try Velcro thumb spica wrist plant and have encouraged her to use Voltaren gel 2-3 times a day to this area.  The Velcro wrist splint can come on in office comfort allows.  I would like to reevaluate her in 4 weeks.  All questions and concerns were answered addressed.  I would certainly consider a second injection at that visit since he is not a diabetic.  I also sent in a steroid taper to her pharmacy. ? ?Follow-Up Instructions: Return in about 4 weeks (around 11/06/2021).  ? ?Orders:  ?Orders Placed This Encounter  ?Procedures  ? Hand/UE Inj  ? XR Finger Thumb Right  ? ?Meds ordered this encounter  ?Medications  ? methylPREDNISolone (MEDROL) 4 MG tablet  ?  Sig: Medrol dose pack. Take as instructed  ?  Dispense:  21 tablet  ?  Refill:  0  ? ? ? ? Procedures: ?Hand/UE Inj: R extensor compartment 1 for de Quervain's tenosynovitis on 10/09/2021 9:48 AM ?Medications: 1 mL lidocaine 1 %; 40 mg methylPREDNISolone acetate 40 MG/ML ? ? ? ? ?Clinical Data: ?No additional findings. ? ? ?Subjective: ?Chief Complaint  ?Patient presents with  ? Right Thumb - Pain  ?Claire Lewis comes in today with right thumb pain and she points to the radial aspect of the wrist and thumb as a source of her pain for about 5 weeks now.  There has been no actual injury but it may be repetitive activities that have bothered things.  She  is 76 years old and is right hand dominant.  She denies any numbness and tingling.  She is not a diabetic. ? ?HPI ? ?Review of Systems ?There is no listed fever, chills, nausea, vomiting ? ?Objective: ?Vital Signs: There were no vitals taken for this visit. ? ?Physical Exam ?She is alert and orient x3 and in no acute distress ?Ortho Exam ?Examination of her right hand shows a positive Finkelstein's test with pain over the first dorsal compartment and the radial styloid area.  However there is also pain at the basilar thumb joint.  There is no triggering.  She is neurovascular intact.  Her wrist and thumb moves normally. ?Specialty Comments:  ?No specialty comments available. ? ?Imaging: ?XR Finger Thumb Right ? ?Result Date: 10/09/2021 ?Several views of the right thumb and wrist show no acute findings.  There is arthritic changes at the basilar thumb joint/CMC joint on that right side.  ? ? ?PMFS History: ?Patient Active Problem List  ? Diagnosis Date Noted  ? Prediabetes 02/08/2019  ? Counseling regarding end of life decision making 11/07/2014  ? Obesity (BMI 30.0-34.9) 11/07/2014  ? Rotator cuff impingement syndrome 01/08/2011  ? PURE  HYPERCHOLESTEROLEMIA 05/24/2009  ? Essential hypertension, benign 01/03/2009  ? ALLERGIC RHINITIS 01/03/2009  ? GERD (gastroesophageal reflux disease) 01/03/2009  ? OTHER LICHEN NOT ELSEWHERE CLASSIFIED 01/03/2009  ? COLONIC POLYPS, HX OF 01/03/2009  ? ?Past Medical History:  ?Diagnosis Date  ? Allergy   ? GERD (gastroesophageal reflux disease)   ? Hypertension   ?  ?Family History  ?Problem Relation Age of Onset  ? Heart attack Father 80  ? Coronary artery disease Father   ? Cancer Mother   ?     breast  ? Dementia Mother   ? Breast cancer Mother   ? Cancer Brother   ?     colon  ? Cancer Paternal Grandmother   ?     lung  ? Breast cancer Paternal Grandmother   ? Meniere's disease Daughter   ?  ?Past Surgical History:  ?Procedure Laterality Date  ? CATARACT EXTRACTION, BILATERAL   04/2019  ? PARTIAL HYSTERECTOMY  1983  ? one ovary remains for endometriosis  ? ?Social History  ? ?Occupational History  ? Occupation: retired from [employer]  ?  Employer: UNEMPLOYED  ?Tobacco Use  ? Smoking status: Never  ? Smokeless tobacco: Never  ?Vaping Use  ? Vaping Use: Never used  ?Substance and Sexual Activity  ? Alcohol use: No  ? Drug use: No  ? Sexual activity: Never  ? ? ? ? ? ? ?

## 2021-11-06 ENCOUNTER — Ambulatory Visit: Payer: PPO | Admitting: Orthopaedic Surgery

## 2021-11-06 ENCOUNTER — Encounter: Payer: Self-pay | Admitting: Orthopaedic Surgery

## 2021-11-06 DIAGNOSIS — M654 Radial styloid tenosynovitis [de Quervain]: Secondary | ICD-10-CM | POA: Diagnosis not present

## 2021-11-06 NOTE — Progress Notes (Signed)
HPI: [name] returns today 4 weeks status post right de Quervain's injection.  She states the injection was very helpful.  She has been wearing the brace that she was given periodically.  No new injury.  Again she was felt to have both de Quervain's and basilar thumb joint arthritis.  She did not use the Voltaren gel to the area.   Review of systems: See HPI otherwise negative or noncontributory.  Physical exam: Right thumb no rashes, skin lesions, ulcerations or ecchymosis.  Positive Finkelstein's  Impression: Right thumb pain Right de Quervain's tenosynovitis  Plan: Discussed with her using Voltaren gel 2 g up to 4 times daily over the first extensor compartment.  Also over the basilar thumb.  Recommend that she wear the Velcro wrist splint with a thumb spica when up she does not need to wear it to bed.  She will follow-up with [place] if pain persist or becomes worse.  Questions encouraged and answered.

## 2021-12-11 DIAGNOSIS — H401131 Primary open-angle glaucoma, bilateral, mild stage: Secondary | ICD-10-CM | POA: Diagnosis not present

## 2021-12-20 ENCOUNTER — Telehealth: Payer: Self-pay

## 2021-12-20 NOTE — Progress Notes (Signed)
    Chronic Care Management Pharmacy Assistant   Name: SHIKIRA FOLINO  MRN: [id number] DOB: [birth year]  Reason for Encounter: CCM (Reschedule Appointment)   Patient cancelled their original telephone appointment with [name] on 12/25/2021. Patient has been rescheduled for 02/10/2022 at 8:45.    [name], CPP notified  [name], [place] Clinical Pharmacy Assistant [phone]

## 2021-12-25 ENCOUNTER — Telehealth: Payer: PPO

## 2021-12-25 DIAGNOSIS — K219 Gastro-esophageal reflux disease without esophagitis: Secondary | ICD-10-CM | POA: Diagnosis not present

## 2022-01-20 ENCOUNTER — Ambulatory Visit: Payer: PPO | Admitting: Physician Assistant

## 2022-01-20 ENCOUNTER — Encounter: Payer: Self-pay | Admitting: Physician Assistant

## 2022-01-20 DIAGNOSIS — M654 Radial styloid tenosynovitis [de Quervain]: Secondary | ICD-10-CM | POA: Diagnosis not present

## 2022-01-20 MED ORDER — METHYLPREDNISOLONE ACETATE 40 MG/ML IJ SUSP
40.0000 mg | INTRAMUSCULAR | Status: AC | PRN
  Administered 2022-01-20: 40 mg

## 2022-01-20 MED ORDER — LIDOCAINE HCL 1 % IJ SOLN
3.0000 mL | INTRAMUSCULAR | Status: AC | PRN
  Administered 2022-01-20: 3 mL

## 2022-01-20 NOTE — Progress Notes (Signed)
   Procedure Note  Patient: Claire Lewis             Date of Birth: Mar 22, 1946           MRN: [id number]             Visit Date: 01/20/2022  HPI: Ms. Matas comes in today with continued pain right wrist.  She was seen by Dr. Ninfa Linden in April and was given cortisone injection for de Quervain's involving the right wrist.  She is asking for another injection.  She states last injection helped in the month.  The thumb spica splint is helping some with the pain.  She is really not been using the Voltaren gel.  Pain does awaken her occasionally at night.  She notes less pain throughout the day and when she is using the hand.  Physical exam: General well-developed well-nourished female no acute distress mood affect appropriate. Right wrist she has tenderness over the first extensor compartment.  Positive Finkelstein's.  Grind test causes some discomfort there is some crepitus.   Procedures: Visit Diagnoses:  1. De Quervain's tenosynovitis, right     Hand/UE Inj: R extensor compartment 1 for de Quervain's tenosynovitis on 01/20/2022 9:28 AM Medications: 3 mL lidocaine 1 %; 40 mg methylPREDNISolone acetate 40 MG/ML     Plan: Continued use of thumb spica splint for pain.  She will use Voltaren gel 2 g 4 times daily to the first extensor compartment.  Follow-up with [place] as needed.  Questions were encouraged and answered at length.

## 2022-02-05 ENCOUNTER — Telehealth: Payer: Self-pay

## 2022-02-05 NOTE — Progress Notes (Signed)
    Chronic Care Management Pharmacy Assistant   Name: Claire Lewis  MRN: [id number] DOB: Apr 14, 1946  Reason for Encounter: CCM (Appointment Reminder)   Recent office visits:  None in past six months  Recent consult visits:  01/20/22 Erskine Emery, PA (Orthopedic Surgery) [name] tenosynovitis Procedure: methylPREDNISolone acetate (DEPO-MEDROL) injection 40 mg Recommended:  Continued use of thumb spica splint for pain. 12/25/21 [name] ([place]) Acid reflux  12/11/21 [name] (Optometry) Primary open-angle glaucoma, bilateral 11/06/21 [name], MD (Ortho) Tennis Must Quervain's tenosynovitis Recommended:  Velcro wrist splint with a thumb spica  10/09/21 [name], MD (Ortho) Pain of right thumb Ordered: XR finger thumb right Procedure: steroid injection around the radial aspect of her wrist - methylPREDNISolone acetate (DEPO-MEDROL) injection 40 mg  Hospital visits:  None in past six months  Medications: Outpatient Encounter Medications as of 02/05/2022  Medication Sig   amLODipine (NORVASC) 10 MG tablet Take 1 tablet (10 mg total) by mouth daily.   atorvastatin (LIPITOR) 20 MG tablet Take 1 tablet (20 mg total) by mouth daily.   Calcium Carbonate-Vitamin D 600-400 MG-UNIT tablet Take 1 tablet by mouth daily.   estradiol (ESTRACE) 0.5 MG tablet Take 1 tablet (0.5 mg total) by mouth every other day. (Patient taking differently: Take 0.5 mg by mouth. Take one tablet 2 times a week.  Patient is trying to wean off medication)   halobetasol (ULTRAVATE) 0.05 % ointment APPLY TO AFFECTED AREA TWICE A DAY   methylPREDNISolone (MEDROL) 4 MG tablet Medrol dose pack. Take as instructed   metoprolol succinate (TOPROL-XL) 50 MG 24 hr tablet Take 1 tablet by mouth once daily with or immediately following a meal   Omega-3 Fatty Acids (FISH OIL) 1200 MG CAPS Take 1 tablet by mouth daily.   omeprazole (PRILOSEC) 40 MG capsule Take 40 mg by mouth daily.   No  facility-administered encounter medications on file as of 02/05/2022.   [name] was contacted to remind of upcoming telephone visit with [name] on 02/10/22 at 8:45. Patient was reminded to have any blood glucose and blood pressure readings available for review at appointment.   Message was left reminding patient of appointment.  CCM referral has been placed prior to visit?  Yes   Star Rating Drugs: Medication:  Last Fill: Day Supply Atorvastatin 20 mg 01/07/22 [name], CPP notified  [name], [facility] Assistant [phone]

## 2022-02-07 ENCOUNTER — Other Ambulatory Visit: Payer: Self-pay | Admitting: Family Medicine

## 2022-02-07 NOTE — Telephone Encounter (Signed)
Last office visit 03/08/21 for CPE.  Last refilled 08/30/21 for 15 g with no refills. CPE scheduled for 03/11/22.

## 2022-02-10 ENCOUNTER — Ambulatory Visit (INDEPENDENT_AMBULATORY_CARE_PROVIDER_SITE_OTHER): Payer: PPO | Admitting: Pharmacist

## 2022-02-10 DIAGNOSIS — E7800 Pure hypercholesterolemia, unspecified: Secondary | ICD-10-CM

## 2022-02-10 DIAGNOSIS — R7303 Prediabetes: Secondary | ICD-10-CM

## 2022-02-10 DIAGNOSIS — I10 Essential (primary) hypertension: Secondary | ICD-10-CM

## 2022-02-10 NOTE — Patient Instructions (Addendum)
Visit Information  Phone number for Pharmacist: [phone]   Goals Addressed   None     Care Plan : [place]  Updates made by Charlton Haws, RPH since 02/10/2022 12:00 AM     Problem: Hypertension, Hyperlipidemia, and Prediabetes      Long-Range Goal: Disease Management   Start Date: 09/04/2020  Expected End Date: 02/10/2022  This Visit's Progress: On track  Priority: High  Note:   Current Barriers:  None identified  Pharmacist Clinical Goal(s):  Patient will contact provider office for questions/concerns as evidenced notation of same in electronic health record through collaboration with PharmD and provider.   Interventions: 1:1 collaboration with Jinny Sanders, MD regarding development and update of comprehensive plan of care as evidenced by provider attestation and co-signature Inter-disciplinary care team collaboration (see longitudinal plan of care) Comprehensive medication review performed; medication list updated in electronic medical record  Hypertension (BP goal <140/90) -Controlled - per home reading -Current home BP: 137/70 -Current treatment: Amlodipine 10 mg daily - Appropriate, Effective, Safe, Accessible Metoprolol succinate 50 mg daily -Appropriate, Effective, Safe, Accessible -Medications previously tried: none  -Counseled to monitor BP at home periodically -Recommended to continue current medication  Hyperlipidemia: (LDL goal < 100) -Controlled - LDL 90 (02/2021) at goal -Current treatment: Atorvastatin 20 mg daily - Appropriate, Effective, Safe, Accessible Fish oil daily - Appropriate, Effective, Safe, Accessible -Medications previously tried: none -Recommended to continue current medication  Prediabetes (Goal: prevent progression to T2DM) -Controlled - A1c 5.8% (02/2021); monitors A1c yearly -Counseled on diet and exercise extensively  Post-menopausal sx (Goal: manage sx) -Controlled - pt is trying to wean off hormone  therapy -Current treatment  Estradiol 0.5 mg - takes twice a week - Appropriate, Effective, Safe, Accessible -Medications previously tried: n/a -Reviewed benefits/risks of hormone therapy, it is beneficial to wean off gradually  -Recommended to continue current medication  Health Maintenance -Vaccine gaps: Shingrix - discussed benefits of Shingrix vs Zostavax, recommended to pursue Shingrix vaccination at local pharmacy -DEXA scan due 03/2021. Discussed, pt plans to have it done this year after AWV.  Patient Goals/Self-Care Activities Patient will:  - take medications as prescribed as evidenced by patient report and record review focus on medication adherence by routine check blood pressure periodically, document, and provide at future appointments       Patient verbalizes understanding of instructions and care plan provided today and agrees to view in [place]. Active MyChart status and patient understanding of how to access instructions and care plan via MyChart confirmed with patient.    The patient has been provided with contact information for the care management team and has been advised to call with any health related questions or concerns.   [name], PharmD, BCACP Clinical Pharmacist [facility] [phone]

## 2022-02-10 NOTE — Progress Notes (Signed)
Chronic Care Management Pharmacy Note  02/10/2022 Name:  Claire Lewis MRN:  [id number] DOB:  1945-09-16  Summary: CCM F/U visit -Reviewed medications; pt affirms compliance and denies issues -BP at goal at home (137/70 today) -Reviewed Shingrix benefits  Recommendations/Changes made from today's visit: -No med changes; advised to pursue Shingrix vaccination at local pharmacy if desired  Plan: -Transition CCM to Self Care: Patient achieved CCM goals and no longer needs to be contacted as frequently. The patient has been provided with contact information for the care management team and has been advised to call with any health related questions or concerns.  -PCP annual 03/11/22   Subjective: Claire Lewis is an 76 y.o. year old female who is a primary patient of Bedsole, Amy E, MD.  The CCM team was consulted for assistance with disease management and care coordination needs.    Engaged with patient by telephone for follow up visit in response to provider referral for pharmacy case management and/or care coordination services.   Consent to Services:  The patient was given information about Chronic Care Management services, agreed to services, and gave verbal consent prior to initiation of services.  Please see initial visit note for detailed documentation.   Patient Care Team: Jinny Sanders, MD as PCP - General Levonne Lapping as Consulting Physician (Dentistry) Charlton Haws, [facility] as Pharmacist (Pharmacist)  Recent office visits: 03/08/21 Dr Diona Browner OV: annual - stable. No changes.  Recent consult visits: 01/20/22 Erskine Emery, PA (Orthopedic Surgery) [name] tenosynovitis Procedure: methylPREDNISolone acetate (DEPO-MEDROL) injection 40 mg Recommended:  Continued use of thumb spica splint for pain. 12/25/21 [name] ([place]) Acid reflux  12/11/21 [name] (Optometry) Primary open-angle glaucoma, bilateral 11/06/21 [name], MD (Ortho)  Tennis Must Quervain's tenosynovitis Recommended:  Velcro wrist splint with a thumb spica  10/09/21 [name], MD (Ortho) Pain of right thumb Ordered: XR finger thumb right Procedure: steroid injection around the radial aspect of her wrist - methylPREDNISolone acetate (DEPO-MEDROL) injection 40 mg  Hospital visits: None in previous 6 months   Objective:  Lab Results  Component Value Date   CREATININE 0.81 03/08/2021   BUN 18 03/08/2021   GFR 70.95 03/08/2021   GFRNONAA 77.81 11/23/2009   NA 139 03/08/2021   K 4.4 03/08/2021   CALCIUM 9.4 03/08/2021   CO2 27 03/08/2021   GLUCOSE 99 03/08/2021    Lab Results  Component Value Date/Time   HGBA1C 5.8 03/08/2021 10:09 AM   HGBA1C 5.8 02/28/2020 07:40 AM   GFR 70.95 03/08/2021 10:09 AM   GFR 62.72 02/28/2020 07:40 AM    Last diabetic Eye exam: No results found for: "HMDIABEYEEXA"  Last diabetic Foot exam: No results found for: "HMDIABFOOTEX"   Lab Results  Component Value Date   CHOL 162 03/08/2021   HDL 46.50 03/08/2021   LDLCALC 90 03/08/2021   TRIG 127.0 03/08/2021   CHOLHDL 3 03/08/2021       Latest Ref Rng & Units 03/08/2021   10:09 AM 02/28/2020    7:40 AM 02/01/2019    9:17 AM  Hepatic Function  Total Protein 6.0 - 8.3 g/dL 6.8  7.0  6.4   Albumin 3.5 - 5.2 g/dL 4.1  4.0  4.0   AST 0 - 37 U/L _0 ALT 0 - 35 U/L _1 Alk Phosphatase 39 - 117 U/L 89  79  76   Total Bilirubin 0.2 - 1.2  mg/dL 0.6  0.6  0.6     No results found for: "TSH", "FREET4"      No data to display          No results found for: "VD25OH"  Clinical ASCVD: No  The 10-year ASCVD risk score (Arnett DK, et al., 2019) is: 26.7%   Values used to calculate the score:     Age: 20 years     Sex: Female     Is Non-Hispanic African American: No     Diabetic: No     Tobacco smoker: No     Systolic Blood Pressure: 803 mmHg     Is BP treated: Yes     HDL Cholesterol: 46.5 mg/dL     Total Cholesterol: 162 mg/dL        03/02/2021    9:00 AM 02/29/2020    8:22 AM 02/08/2019   10:12 AM  Depression screen PHQ 2/9  Decreased Interest 0 0 0  Down, Depressed, Hopeless 0 0 0  PHQ - 2 Score 0 0 0  Altered sleeping  0   Tired, decreased energy  0   Change in appetite  0   Feeling bad or failure about yourself   0   Trouble concentrating  0   Moving slowly or fidgety/restless  0   Suicidal thoughts  0   PHQ-9 Score  0   Difficult doing work/chores  Not difficult at all      Social History   Tobacco Use  Smoking Status Never  Smokeless Tobacco Never   BP Readings from Last 3 Encounters:  03/08/21 140/60  03/02/21 137/67  10/09/20 127/77   Pulse Readings from Last 3 Encounters:  03/08/21 60  10/09/20 64  09/06/20 64   Wt Readings from Last 3 Encounters:  03/08/21 214 lb 6 oz (97.2 kg)  03/02/21 205 lb (93 kg)  10/09/20 220 lb 8 oz (100 kg)   BMI Readings from Last 3 Encounters:  03/08/21 35.40 kg/m  03/02/21 34.11 kg/m  10/09/20 36.14 kg/m    Assessment/Interventions: Review of patient past medical history, allergies, medications, health status, including review of consultants reports, laboratory and other test data, was performed as part of comprehensive evaluation and provision of chronic care management services.   SDOH:  (Social Determinants of Health) assessments and interventions performed: No  SDOH Screenings   Alcohol Screen: Low Risk  (02/29/2020)   Alcohol Screen    Last Alcohol Screening Score (AUDIT): 0  Depression (PHQ2-9): Low Risk  (03/02/2021)   Depression (PHQ2-9)    PHQ-2 Score: 0  Financial Resource Strain: Low Risk  (03/02/2021)   Overall Financial Resource Strain (CARDIA)    Difficulty of Paying Living Expenses: Not hard at all  Food Insecurity: No Food Insecurity (03/02/2021)   Hunger Vital Sign    Worried About Running Out of Food in the Last Year: Never true    [place] in the Last Year: Never true  Housing: Low Risk  (03/02/2021)   Housing    Last  Housing Risk Score: 0  Physical Activity: Inactive (03/02/2021)   Exercise Vital Sign    Days of Exercise per Week: 0 days    Minutes of Exercise per Session: 0 min  Social Connections: Not on file  Stress: No Stress Concern Present (03/02/2021)   [place]    Feeling of Stress : Not at all  Tobacco Use: Low Risk  (01/20/2022)  Patient History    Smoking Tobacco Use: Never    Smokeless Tobacco Use: Never    Passive Exposure: Not on file  Transportation Needs: No Transportation Needs (03/02/2021)   PRAPARE - Transportation    Lack of Transportation (Medical): No    Lack of Transportation (Non-Medical): No    CCM Care Plan  No Known Allergies  Medications Reviewed Today     Reviewed by Charlton Haws, [facility] (Pharmacist) on 02/10/22 at 0900  Med List Status: <None>   Medication Order Taking? Sig Documenting Provider Last Dose Status Informant  amLODipine (NORVASC) 10 MG tablet [id number] Yes Take 1 tablet (10 mg total) by mouth daily. Jinny Sanders, MD Taking Active   atorvastatin (LIPITOR) 20 MG tablet [id number] Yes Take 1 tablet (20 mg total) by mouth daily. Jinny Sanders, MD Taking Active   Calcium Carbonate-Vitamin D 600-400 MG-UNIT tablet [id number] Yes Take 1 tablet by mouth daily. [provider] Taking Active   estradiol (ESTRACE) 0.5 MG tablet [id number] Yes Take 1 tablet (0.5 mg total) by mouth every other day.  Patient taking differently: Take 0.5 mg by mouth. Take one tablet 2 times a week.  Patient is trying to wean off medication   Bedsole, Amy E, MD Taking Active   halobetasol (ULTRAVATE) 0.05 % ointment [id number] Yes APPLY TO AFFECTED AREA TWICE A DAY Bedsole, Amy E, MD Taking Active   metoprolol succinate (TOPROL-XL) 50 MG 24 hr tablet [id number] Yes Take 1 tablet by mouth once daily with or immediately following a meal Bedsole, Amy E, MD Taking Active   Omega-3 Fatty Acids (FISH OIL) 1200 MG  CAPS [id number] Yes Take 1 tablet by mouth daily. [provider] Taking Active Self  omeprazole (PRILOSEC) 40 MG capsule [id number] Yes Take 40 mg by mouth daily. [provider] Taking Active             Patient Active Problem List   Diagnosis Date Noted   Prediabetes 02/08/2019   Counseling regarding end of life decision making 11/07/2014   Obesity (BMI 30.0-34.9) 11/07/2014   Rotator cuff impingement syndrome 01/08/2011   PURE HYPERCHOLESTEROLEMIA 05/24/2009   Essential hypertension, benign 01/03/2009   ALLERGIC RHINITIS 01/03/2009   GERD (gastroesophageal reflux disease) 20/60/1561   OTHER LICHEN NOT ELSEWHERE CLASSIFIED 01/03/2009   COLONIC POLYPS, HX OF 01/03/2009    Immunization History  Administered Date(s) Administered   Fluad Quad(high Dose 65+) 02/08/2019, 03/01/2020, 03/08/2021   Influenza Whole 04/16/2010   Influenza, High Dose Seasonal PF 06/15/2015, 04/13/2018   PFIZER Comirnaty(Gray Top)Covid-19 Tri-Sucrose Vaccine 01/24/2021   PFIZER(Purple Top)SARS-COV-2 Vaccination 07/23/2019, 08/13/2019, 04/13/2020, 01/24/2021   Pneumococcal Conjugate-13 11/07/2014   Pneumococcal Polysaccharide-23 10/04/2013   Td 06/19/2009   Zoster, Live 05/16/2008    Conditions to be addressed/monitored:  Hypertension, Hyperlipidemia, and Prediabetes  Care Plan : [place]  Updates made by Charlton Haws, [place] since 02/10/2022 12:00 AM     Problem: Hypertension, Hyperlipidemia, and Prediabetes      Long-Range Goal: Disease Management   Start Date: 09/04/2020  Expected End Date: 02/10/2022  This Visit's Progress: On track  Priority: High  Note:   Current Barriers:  None identified  Pharmacist Clinical Goal(s):  Patient will contact provider office for questions/concerns as evidenced notation of same in electronic health record through collaboration with PharmD and provider.   Interventions: 1:1 collaboration with Jinny Sanders, MD  regarding development and update of comprehensive plan of care as evidenced by provider attestation and  co-signature Inter-disciplinary care team collaboration (see longitudinal plan of care) Comprehensive medication review performed; medication list updated in electronic medical record  Hypertension (BP goal <140/90) -Controlled - per home reading -Current home BP: 137/70 -Current treatment: Amlodipine 10 mg daily - Appropriate, Effective, Safe, Accessible Metoprolol succinate 50 mg daily -Appropriate, Effective, Safe, Accessible -Medications previously tried: none  -Counseled to monitor BP at home periodically -Recommended to continue current medication  Hyperlipidemia: (LDL goal < 100) -Controlled - LDL 90 (02/2021) at goal -Current treatment: Atorvastatin 20 mg daily - Appropriate, Effective, Safe, Accessible Fish oil daily - Appropriate, Effective, Safe, Accessible -Medications previously tried: none -Recommended to continue current medication  Prediabetes (Goal: prevent progression to T2DM) -Controlled - A1c 5.8% (02/2021); monitors A1c yearly -Counseled on diet and exercise extensively  Post-menopausal sx (Goal: manage sx) -Controlled - pt is trying to wean off hormone therapy -Current treatment  Estradiol 0.5 mg - takes twice a week - Appropriate, Effective, Safe, Accessible -Medications previously tried: n/a -Reviewed benefits/risks of hormone therapy, it is beneficial to wean off gradually  -Recommended to continue current medication  Health Maintenance -Vaccine gaps: Shingrix - discussed benefits of Shingrix vs Zostavax, recommended to pursue Shingrix vaccination at [place] scan due 03/2021. Discussed, pt plans to have it done this year after AWV.  Patient Goals/Self-Care Activities Patient will:  - take medications as prescribed as evidenced by patient report and record review focus on medication adherence by routine check blood pressure periodically,  document, and provide at future appointments       Medication Assistance: None required.  Patient affirms current coverage meets needs.  Compliance/Adherence/Medication fill history: Care Gaps: DEXA scan (due 03/20/21)  Star-Rating Drugs: Atorvastatin - PDC 100%  Medication Access: Within the past 30 days, how often has patient missed a dose of medication? 0 Is a pillbox or other method used to improve adherence? No  Factors that may affect medication adherence? no barriers identified Are meds synced by current pharmacy? No  Are meds delivered by current pharmacy? Yes  Does patient experience delays in picking up medications due to transportation concerns? No   [facility] Reviewed: Is patient disadvantaged to use [facility]?: No  Current Rx insurance plan: HTA Name and location of Current pharmacy:  [facility]) - [place], [address] [place] [address] Phone: [phone] Fax: [phone]  [id number] [place]: [phone]: [phone] [facility] services reviewed with patient today?: No  Patient requests to transfer care to [facility]?: No  Reason patient declined to change pharmacies: Not mentioned at this visit   Care Plan and Follow Up Patient Decision:  Patient agrees to Care Plan and Follow-up.  Plan: The patient has been provided with contact information for the care management team and has been advised to call with any health related questions or concerns.   LCharlene Brooke PharmD, BCACP Clinical Pharmacist [phone]

## 2022-02-13 DIAGNOSIS — E785 Hyperlipidemia, unspecified: Secondary | ICD-10-CM

## 2022-02-13 DIAGNOSIS — I10 Essential (primary) hypertension: Secondary | ICD-10-CM | POA: Diagnosis not present

## 2022-03-03 ENCOUNTER — Telehealth: Payer: Self-pay | Admitting: Family Medicine

## 2022-03-03 DIAGNOSIS — E7800 Pure hypercholesterolemia, unspecified: Secondary | ICD-10-CM

## 2022-03-03 DIAGNOSIS — R7303 Prediabetes: Secondary | ICD-10-CM

## 2022-03-03 NOTE — Telephone Encounter (Signed)
-----   Message from Ellamae Sia sent at 02/25/2022  3:03 PM EDT ----- Regarding: Lab orders for Tuesday, 9.19.23 Patient is scheduled for CPX labs, please order future labs, Thanks , Karna Christmas

## 2022-03-04 ENCOUNTER — Other Ambulatory Visit (INDEPENDENT_AMBULATORY_CARE_PROVIDER_SITE_OTHER): Payer: PPO

## 2022-03-04 DIAGNOSIS — R7303 Prediabetes: Secondary | ICD-10-CM

## 2022-03-04 DIAGNOSIS — E7800 Pure hypercholesterolemia, unspecified: Secondary | ICD-10-CM | POA: Diagnosis not present

## 2022-03-04 LAB — COMPREHENSIVE METABOLIC PANEL
ALT: 16 U/L (ref 0–35)
AST: 17 U/L (ref 0–37)
Albumin: 3.9 g/dL (ref 3.5–5.2)
Alkaline Phosphatase: 90 U/L (ref 39–117)
BUN: 20 mg/dL (ref 6–23)
CO2: 28 mEq/L (ref 19–32)
Calcium: 9.5 mg/dL (ref 8.4–10.5)
Chloride: 104 mEq/L (ref 96–112)
Creatinine, Ser: 1.02 mg/dL (ref 0.40–1.20)
GFR: 53.43 mL/min — ABNORMAL LOW (ref >60.00)
Glucose, Bld: 106 mg/dL — ABNORMAL HIGH (ref 70–99)
Potassium: 4.3 mEq/L (ref 3.5–5.1)
Sodium: 140 mEq/L (ref 135–145)
Total Bilirubin: 0.6 mg/dL (ref 0.2–1.2)
Total Protein: 6.8 g/dL (ref 6.0–8.3)

## 2022-03-04 LAB — LIPID PANEL
Cholesterol: 167 mg/dL (ref 0–200)
HDL: 52.3 mg/dL (ref >39.00)
LDL Cholesterol: 91 mg/dL (ref 0–99)
NonHDL: 114.94
Total CHOL/HDL Ratio: 3
Triglycerides: 119 mg/dL (ref 0.0–149.0)
VLDL: 23.8 mg/dL (ref 0.0–40.0)

## 2022-03-04 LAB — HEMOGLOBIN A1C: Hgb A1c MFr Bld: 5.9 % (ref 4.6–6.5)

## 2022-03-04 NOTE — Progress Notes (Signed)
No critical labs need to be addressed urgently. We will discuss labs in detail at upcoming office visit.   

## 2022-03-06 ENCOUNTER — Ambulatory Visit: Payer: PPO

## 2022-03-11 ENCOUNTER — Other Ambulatory Visit: Payer: Self-pay | Admitting: Family Medicine

## 2022-03-11 ENCOUNTER — Encounter: Payer: Self-pay | Admitting: Family Medicine

## 2022-03-11 ENCOUNTER — Ambulatory Visit (INDEPENDENT_AMBULATORY_CARE_PROVIDER_SITE_OTHER): Payer: PPO | Admitting: Family Medicine

## 2022-03-11 VITALS — BP 124/66 | HR 66 | Temp 98.5°F | Ht 65.25 in | Wt 220.5 lb

## 2022-03-11 DIAGNOSIS — Z0000 Encounter for general adult medical examination without abnormal findings: Secondary | ICD-10-CM | POA: Diagnosis not present

## 2022-03-11 DIAGNOSIS — Z23 Encounter for immunization: Secondary | ICD-10-CM

## 2022-03-11 DIAGNOSIS — I10 Essential (primary) hypertension: Secondary | ICD-10-CM | POA: Diagnosis not present

## 2022-03-11 DIAGNOSIS — E7800 Pure hypercholesterolemia, unspecified: Secondary | ICD-10-CM | POA: Diagnosis not present

## 2022-03-11 DIAGNOSIS — E2839 Other primary ovarian failure: Secondary | ICD-10-CM

## 2022-03-11 DIAGNOSIS — Z1231 Encounter for screening mammogram for malignant neoplasm of breast: Secondary | ICD-10-CM

## 2022-03-11 DIAGNOSIS — R7303 Prediabetes: Secondary | ICD-10-CM | POA: Diagnosis not present

## 2022-03-11 NOTE — Patient Instructions (Signed)
Please call the location of your choice from the menu below to schedule your Mammogram and/or Bone Density appointment.    [facility]                      Phone:  [phone] [address]                               [place], [place] [address]                                                             Services: Traditional and 3D Mammogram, Bone Density   [facility] [place] [facility]                 Phone: [phone] [address]                                                       [place], [place] [address]    Service: Bone Density ONLY   *this site does NOT perform mammograms  [facility] [place]                        Phone:  [phone] [address]                                  [place], [place] [address]                                            Services:  3D Mammogram and Bone Density    

## 2022-03-11 NOTE — Assessment & Plan Note (Signed)
Encouraged exercise, weight loss, healthy eating habits. ? ?

## 2022-03-11 NOTE — Assessment & Plan Note (Signed)
Stable, chronic.  Continue current medication.   amlodipine 10 mg p.o. daily, metoprolol XL 50 mg daily

## 2022-03-11 NOTE — Assessment & Plan Note (Signed)
Stable, chronic.  Continue current medication. ? ? ?Atorvastatin 20 mg daily ?

## 2022-03-11 NOTE — Progress Notes (Signed)
Patient ID: Claire Lewis, female    DOB: 1945-12-19, 76 y.o.   MRN: [id number]  This visit was conducted in person.  BP 124/66   Pulse 66   Temp 98.5 F (36.9 C) (Oral)   Ht 5' 5.25" (1.657 m)   Wt 220 lb 8 oz (100 kg)   SpO2 96%   BMI 36.41 kg/m    CC:  Chief Complaint  Patient presents with   Medicare Wellness    Subjective:   HPI: Claire Lewis is a 76 y.o. female presenting on 03/11/2022 for Medicare Wellness   The patient presents for annual medicare wellness, complete physical and review of chronic health problems. He/She also has the following acute concerns today: none  I have personally reviewed the Medicare Annual Wellness questionnaire and have noted 1. The patient's medical and social history 2. Their use of alcohol, tobacco or illicit drugs 3. Their current medications and supplements 4. The patient's functional ability including ADL's, fall risks, home safety risks and hearing or visual             impairment. 5. Diet and physical activities 6. Evidence for depression or mood disorders 7.         Updated provider list  Cognitive evaluation was performed and recorded on pt medicare questionnaire form. The patients weight, height, BMI and visual acuity have been recorded in the chart   I have made referrals, counseling and provided education to the patient based review of the above and I have provided the pt with a written personalized care plan for preventive services. .  Documentation of this information was scanned into the electronic record under the media tab.   Advance directives and end of life planning reviewed in detail with patient and documented in EMR. Patient given handout on advance care directives if needed. HCPOA and living will updated if needed.  No falls in last 12 months.  [place] Visit from 03/11/2022 in [place] at [facility]  PHQ-2 Total Score 0      Hypertension:  Blood pressure at goal on  amlodipine 10 mg p.o. daily, metoprolol XL 50 mg daily BP Readings from Last 3 Encounters:  03/11/22 124/66  03/08/21 140/60  03/02/21 137/67  Using medication without problems or lightheadedness:  none Chest pain with exertion: none Edema:none Short of breath: none Average home BPs: Other issues:   Renal function decreased.. not getting much water. Had been using more ibuprofen lately for wrist issue.  prediabetes  Lab Results  Component Value Date   HGBA1C 5.9 03/04/2022     Elevated Cholesterol: LDL at goal on atorvastatin 20 mg p.o. daily Lab Results  Component Value Date   CHOL 167 03/04/2022   HDL 52.30 03/04/2022   LDLCALC 91 03/04/2022   TRIG 119.0 03/04/2022   CHOLHDL 3 03/04/2022  Using medications without problems: none Muscle aches:  none Diet compliance: moderate, minimal water  Exercise: walking 2-3 times per week Other complaints:       Relevant past medical, surgical, family and social history reviewed and updated as indicated. Interim medical history since our last visit reviewed. Allergies and medications reviewed and updated. Outpatient Medications Prior to Visit  Medication Sig Dispense Refill   amLODipine (NORVASC) 10 MG tablet Take 1 tablet (10 mg total) by mouth daily. 90 tablet 3   atorvastatin (LIPITOR) 20 MG tablet Take 1 tablet (20 mg total) by mouth daily. 90 tablet 3   Calcium Carbonate-Vitamin D  600-400 MG-UNIT tablet Take 1 tablet by mouth daily.     estradiol (ESTRACE) 0.5 MG tablet Take 1 tablet (0.5 mg total) by mouth every other day. (Patient taking differently: Take 0.5 mg by mouth. Take one tablet 2 times a week.  Patient is trying to wean off medication) 45 tablet 3   halobetasol (ULTRAVATE) 0.05 % ointment APPLY TO AFFECTED AREA TWICE A DAY 15 g 0   metoprolol succinate (TOPROL-XL) 50 MG 24 hr tablet Take 1 tablet by mouth once daily with or immediately following a meal 90 tablet 3   Omega-3 Fatty Acids (FISH OIL) 1200 MG CAPS  Take 1 tablet by mouth daily.     omeprazole (PRILOSEC) 40 MG capsule Take 40 mg by mouth daily.     No facility-administered medications prior to visit.     Per HPI unless specifically indicated in ROS section below Review of Systems  Constitutional:  Negative for fatigue and fever.  HENT:  Negative for congestion.   Eyes:  Negative for pain.  Respiratory:  Negative for cough and shortness of breath.   Cardiovascular:  Negative for chest pain, palpitations and leg swelling.  Gastrointestinal:  Negative for abdominal pain.  Genitourinary:  Negative for dysuria and vaginal bleeding.  Musculoskeletal:  Negative for back pain.  Neurological:  Negative for syncope, light-headedness and headaches.  Psychiatric/Behavioral:  Negative for dysphoric mood.    Objective:  BP 124/66   Pulse 66   Temp 98.5 F (36.9 C) (Oral)   Ht 5' 5.25" (1.657 m)   Wt 220 lb 8 oz (100 kg)   SpO2 96%   BMI 36.41 kg/m   Wt Readings from Last 3 Encounters:  03/11/22 220 lb 8 oz (100 kg)  03/08/21 214 lb 6 oz (97.2 kg)  03/02/21 205 lb (93 kg)      Physical Exam Vitals and nursing note reviewed.  Constitutional:      General: She is not in acute distress.    Appearance: Normal appearance. She is well-developed. She is obese. She is not ill-appearing or toxic-appearing.  HENT:     Head: Normocephalic.     Right Ear: Hearing, tympanic membrane, ear canal and external ear normal.     Left Ear: Hearing, tympanic membrane, ear canal and external ear normal.     Nose: Nose normal.  Eyes:     General: Lids are normal. Lids are everted, no foreign bodies appreciated.     Conjunctiva/sclera: Conjunctivae normal.     Pupils: Pupils are equal, round, and reactive to light.  Neck:     Thyroid: No thyroid mass or thyromegaly.     Vascular: No carotid bruit.     Trachea: Trachea normal.  Cardiovascular:     Rate and Rhythm: Normal rate and regular rhythm.     Heart sounds: Normal heart sounds, S1 normal and  S2 normal. No murmur heard.    No gallop.  Pulmonary:     Effort: Pulmonary effort is normal. No respiratory distress.     Breath sounds: Normal breath sounds. No wheezing, rhonchi or rales.  Abdominal:     General: Bowel sounds are normal. There is no distension or abdominal bruit.     Palpations: Abdomen is soft. There is no fluid wave or mass.     Tenderness: There is no abdominal tenderness. There is no guarding or rebound.     Hernia: No hernia is present.  Musculoskeletal:     Cervical back: Normal range of motion  and neck supple.  Lymphadenopathy:     Cervical: No cervical adenopathy.  Skin:    General: Skin is warm and dry.     Findings: No rash.  Neurological:     Mental Status: She is alert.     Cranial Nerves: No cranial nerve deficit.     Sensory: No sensory deficit.  Psychiatric:        Mood and Affect: Mood is not anxious or depressed.        Speech: Speech normal.        Behavior: Behavior normal. Behavior is cooperative.        Judgment: Judgment normal.       Results for orders placed or performed in visit on 03/04/22  Comprehensive metabolic panel  Result Value Ref Range   Sodium 140 135 - 145 mEq/L   Potassium 4.3 3.5 - 5.1 mEq/L   Chloride 104 96 - 112 mEq/L   CO2 28 19 - 32 mEq/L   Glucose, Bld 106 (H) 70 - 99 mg/dL   BUN 20 6 - 23 mg/dL   Creatinine, Ser 1.02 0.40 - 1.20 mg/dL   Total Bilirubin 0.6 0.2 - 1.2 mg/dL   Alkaline Phosphatase 90 39 - 117 U/L   AST 17 0 - 37 U/L   ALT 16 0 - 35 U/L   Total Protein 6.8 6.0 - 8.3 g/dL   Albumin 3.9 3.5 - 5.2 g/dL   GFR 53.43 (L) >60.00 mL/min   Calcium 9.5 8.4 - 10.5 mg/dL  Lipid panel  Result Value Ref Range   Cholesterol 167 0 - 200 mg/dL   Triglycerides 119.0 0.0 - 149.0 mg/dL   HDL 52.30 >39.00 mg/dL   VLDL 23.8 0.0 - 40.0 mg/dL   LDL Cholesterol 91 0 - 99 mg/dL   Total CHOL/HDL Ratio 3    NonHDL 114.94   Hemoglobin A1c  Result Value Ref Range   Hgb A1c MFr Bld 5.9 4.6 - 6.5 %     COVID  19 screen:  No recent travel or known exposure to COVID19 The patient denies respiratory symptoms of COVID 19 at this time. The importance of social distancing was discussed today.   Assessment and Plan   The patient's preventative maintenance and recommended screening tests for an annual wellness exam were reviewed in full today. Brought up to date unless services declined.  Counselled on the importance of diet, exercise, and its role in overall health and mortality. The patient's FH and SH was reviewed, including their home life, tobacco status, and drug and alcohol status.   Vaccines: Uptodate with PNA,consider shingles, given high dose flu today. Refused td. S/P COVID19 vaccine series x 4. DEXA: stable, normal 03/2016, On HRT.   repeat in 5 years.. plan repeat in 2023 DUE NOW Mammogram: scheduled 03/2021, MGM, mother with breast cancer. DVE/PAP : TAH but has one ovary...  No symptoms, no family histroy of ovarian cancer.    No need for DVE, pt agreeable Colon: 2019 polyps, family history colon cancer brother, repeat in 41 years, Dr. Benson [place].  Nonsmoker.  Hep c: done  Problem List Items Addressed This Visit     Essential hypertension, benign    Stable, chronic.  Continue current medication.   amlodipine 10 mg p.o. daily, metoprolol XL 50 mg daily      Prediabetes    Encouraged exercise, weight loss, healthy eating habits.       PURE HYPERCHOLESTEROLEMIA    Stable, chronic.  Continue current medication.  Atorvastatin 20 mg daily      Other Visit Diagnoses     Medicare annual wellness visit, subsequent    -  Primary   Need for influenza vaccination       Relevant Orders   Flu Vaccine QUAD High Dose(Fluad) (Completed)   Estrogen deficiency       Relevant Orders   DG Bone Density          Eliezer Lofts, MD

## 2022-03-25 ENCOUNTER — Telehealth: Payer: Self-pay | Admitting: Family Medicine

## 2022-03-25 NOTE — Telephone Encounter (Signed)
[facility] came by and left copies of her living will,and advance directives. I made copies and left them in you box.

## 2022-04-03 ENCOUNTER — Ambulatory Visit: Payer: PPO

## 2022-04-11 ENCOUNTER — Other Ambulatory Visit: Payer: Self-pay | Admitting: *Deleted

## 2022-04-11 DIAGNOSIS — I10 Essential (primary) hypertension: Secondary | ICD-10-CM

## 2022-04-11 DIAGNOSIS — E7800 Pure hypercholesterolemia, unspecified: Secondary | ICD-10-CM

## 2022-04-11 MED ORDER — AMLODIPINE BESYLATE 10 MG PO TABS: 10.0000 mg | ORAL_TABLET | Freq: Every day | ORAL | 3 refills | Status: DC

## 2022-04-11 MED ORDER — ATORVASTATIN CALCIUM 20 MG PO TABS: 20.0000 mg | ORAL_TABLET | Freq: Every day | ORAL | 3 refills | Status: DC

## 2022-04-11 MED ORDER — METOPROLOL SUCCINATE ER 50 MG PO TB24: ORAL_TABLET | ORAL | 3 refills | Status: DC

## 2022-04-11 MED ORDER — ESTRADIOL 0.5 MG PO TABS
ORAL_TABLET | ORAL | 0 refills | Status: DC
  Filled 2022-06-24 – 2022-08-12 (×2): qty 10, 40d supply, fill #0

## 2022-06-24 ENCOUNTER — Other Ambulatory Visit (HOSPITAL_COMMUNITY): Payer: Self-pay

## 2022-06-24 MED ORDER — OMEPRAZOLE 40 MG PO CPDR
40.0000 mg | DELAYED_RELEASE_CAPSULE | Freq: Every day | ORAL | 1 refills | Status: DC
  Filled 2022-06-24 – 2022-08-12 (×2): qty 90, 90d supply, fill #0
  Filled 2022-12-11: qty 90, 90d supply, fill #1

## 2022-06-24 MED ORDER — ATORVASTATIN CALCIUM 20 MG PO TABS
20.0000 mg | ORAL_TABLET | Freq: Every day | ORAL | 2 refills | Status: DC
  Filled 2022-06-24 – 2022-09-14 (×3): qty 90, 90d supply, fill #0
  Filled 2023-02-08: qty 90, 90d supply, fill #1

## 2022-06-24 MED ORDER — METOPROLOL SUCCINATE ER 50 MG PO TB24
50.0000 mg | ORAL_TABLET | Freq: Every day | ORAL | 1 refills | Status: DC
  Filled 2022-06-24 – 2022-09-14 (×3): qty 90, 90d supply, fill #0

## 2022-06-24 MED ORDER — AMLODIPINE BESYLATE 10 MG PO TABS
10.0000 mg | ORAL_TABLET | Freq: Every day | ORAL | 1 refills | Status: DC
  Filled 2022-06-24 – 2022-09-14 (×3): qty 90, 90d supply, fill #0

## 2022-06-25 ENCOUNTER — Other Ambulatory Visit (HOSPITAL_COMMUNITY): Payer: Self-pay

## 2022-07-03 ENCOUNTER — Other Ambulatory Visit: Payer: Self-pay | Admitting: Family Medicine

## 2022-07-03 NOTE — Telephone Encounter (Signed)
Last office visit 03/11/22 for [place].  Last refilled 02/07/22 for 15 g with no refills. CPE scheduled for 03/13/23.

## 2022-08-12 ENCOUNTER — Other Ambulatory Visit (HOSPITAL_COMMUNITY): Payer: Self-pay

## 2022-08-12 ENCOUNTER — Other Ambulatory Visit: Payer: Self-pay

## 2022-08-21 ENCOUNTER — Ambulatory Visit
Admission: RE | Admit: 2022-08-21 | Discharge: 2022-08-21 | Disposition: A | Discharge status: Home / Self Care | Payer: PPO | Source: Ambulatory Visit | Attending: Family Medicine | Admitting: Family Medicine

## 2022-08-21 ENCOUNTER — Encounter: Payer: Self-pay | Admitting: Family Medicine

## 2022-08-21 ENCOUNTER — Encounter: Payer: Self-pay | Admitting: Radiology

## 2022-08-21 DIAGNOSIS — M8580 Other specified disorders of bone density and structure, unspecified site: Secondary | ICD-10-CM | POA: Insufficient documentation

## 2022-08-21 DIAGNOSIS — E2839 Other primary ovarian failure: Secondary | ICD-10-CM

## 2022-08-21 DIAGNOSIS — Z1231 Encounter for screening mammogram for malignant neoplasm of breast: Secondary | ICD-10-CM | POA: Diagnosis not present

## 2022-08-21 DIAGNOSIS — Z780 Asymptomatic menopausal state: Secondary | ICD-10-CM | POA: Diagnosis not present

## 2022-08-21 DIAGNOSIS — M85851 Other specified disorders of bone density and structure, right thigh: Secondary | ICD-10-CM | POA: Diagnosis not present

## 2022-08-25 ENCOUNTER — Encounter: Payer: Self-pay | Admitting: Internal Medicine

## 2022-08-25 ENCOUNTER — Ambulatory Visit (INDEPENDENT_AMBULATORY_CARE_PROVIDER_SITE_OTHER): Payer: PPO | Admitting: Internal Medicine

## 2022-08-25 VITALS — BP 136/72 | HR 68 | Temp 97.7°F | Ht 65.25 in | Wt 217.0 lb

## 2022-08-25 DIAGNOSIS — R112 Nausea with vomiting, unspecified: Secondary | ICD-10-CM | POA: Diagnosis not present

## 2022-08-25 MED ORDER — ONDANSETRON HCL 4 MG PO TABS: 4.0000 mg | ORAL_TABLET | Freq: Three times a day (TID) | ORAL | 0 refills | Status: DC | PRN

## 2022-08-25 NOTE — Progress Notes (Signed)
Subjective:    Patient ID: Claire Lewis, female    DOB: 06-03-1946, 77 y.o.   MRN: [id number]  HPI Here due to facial swelling and abdominal discomfort  Had [place] [employer] sandwich 3/6 About 1.5 hours later---had sig vomiting spell Later---she couldn't take medicine or drink (vomited again) Didn't eat anything the rest of the day  Then noticed swelling in cheeks and red splotches---persisted for 2 days Improved the next day--and mostly gone by tomorrow  Last vomiting was 2 nights ago Still not eating much--afraid of more vomiting Tolerating saltines/sprite Still with abdominal bloating and "rolling around"  Current Outpatient Medications on File Prior to Visit  Medication Sig Dispense Refill   amLODipine (NORVASC) 10 MG tablet Take 1 tablet (10 mg total) by mouth daily. 90 tablet 1   atorvastatin (LIPITOR) 20 MG tablet Take 1 tablet (20 mg total) by mouth daily. 90 tablet 1   Calcium Carbonate-Vitamin D 600-400 MG-UNIT tablet Take 1 tablet by mouth daily.     estradiol (ESTRACE) 0.5 MG tablet Take 1 tablet by mouth every 4 days 32 tablet 0   halobetasol (ULTRAVATE) 0.05 % ointment APPLY TO AFFECTED AREA TWICE A DAY 15 g 0   latanoprost (XALATAN) 0.005 % ophthalmic solution 1 drop at bedtime.     metoprolol succinate (TOPROL-XL) 50 MG 24 hr tablet Take 1 tablet by mouth once daily with or immediately following a meal 90 tablet 3   metoprolol succinate (TOPROL-XL) 50 MG 24 hr tablet Take 1 tablet (50 mg total) by mouth daily. 90 tablet 1   Omega-3 Fatty Acids (FISH OIL) 1200 MG CAPS Take 1 tablet by mouth daily.     omeprazole (PRILOSEC) 40 MG capsule Take 40 mg by mouth daily.     omeprazole (PRILOSEC) 40 MG capsule Take 1 capsule (40 mg total) by mouth daily before breakfast. 90 capsule 1   No current facility-administered medications on file prior to visit.    No Known Allergies  Past Medical History:  Diagnosis Date   Allergy    GERD (gastroesophageal reflux disease)     Hypertension     Past Surgical History:  Procedure Laterality Date   CATARACT EXTRACTION, BILATERAL  04/2019   PARTIAL HYSTERECTOMY  1983   one ovary remains for endometriosis    Family History  Problem Relation Age of Onset   Heart attack Father 30   Coronary artery disease Father    Cancer Mother        breast   Dementia Mother    Breast cancer Mother    Cancer Brother        colon   Cancer Paternal Grandmother        lung   Breast cancer Paternal Grandmother    Meniere's disease Daughter     Social History   Socioeconomic History   Marital status: Married    Spouse name: Not on file   Number of children: Not on file   Years of education: Not on file   Highest education level: Not on file  Occupational History   Occupation: retired from [employer], operational: UNEMPLOYED  Tobacco Use   Smoking status: Never   Smokeless tobacco: Never  Vaping Use   Vaping Use: Never used  Substance and Sexual Activity   Alcohol use: No   Drug use: No   Sexual activity: Never  Other Topics Concern   Not on file  Social History Narrative   Regular exercise---walks 3-4 times  a week   Diet: fruits and veggies, no fried foods, on weight watchers                  Social Determinants of Health   Financial Resource Strain: Low Risk  (03/02/2021)   Overall Financial Resource Strain (CARDIA)    Difficulty of Paying Living Expenses: Not hard at all  Food Insecurity: No Food Insecurity (03/02/2021)   Hunger Vital Sign    Worried About Running Out of Food in the Last Year: Never true    Ran Out of Food in the Last Year: Never true  Transportation Needs: No Transportation Needs (03/02/2021)   PRAPARE - Hydrologist (Medical): No    Lack of Transportation (Non-Medical): No  Physical Activity: Inactive (03/02/2021)   Exercise Vital Sign    Days of Exercise per Week: 0 days    Minutes of Exercise per Session: 0 min  Stress: No Stress Concern  Present (03/02/2021)   [place]    Feeling of Stress : Not at all  Social Connections: Not on file  Intimate Partner Violence: Not At Risk (02/29/2020)   Humiliation, Afraid, Rape, and Kick questionnaire    Fear of Current or Ex-Partner: No    Emotionally Abused: No    Physically Abused: No    Sexually Abused: No   Review of Systems Did have temp of 100 three days ago Slight loose stool yesterday--had been normal otherwise No cough or SOB No prior food reactions    Objective:   Physical Exam Constitutional:      Appearance: Normal appearance.  HENT:     Head:     Comments: No facial swelling or redness    Mouth/Throat:     Pharynx: No oropharyngeal exudate or posterior oropharyngeal erythema.  Pulmonary:     Effort: Pulmonary effort is normal.     Breath sounds: Normal breath sounds. No wheezing or rales.  Abdominal:     General: Bowel sounds are normal. There is no distension.     Palpations: Abdomen is soft.     Tenderness: There is no abdominal tenderness. There is no guarding or rebound.  Musculoskeletal:     Cervical back: Neck supple.  Lymphadenopathy:     Cervical: No cervical adenopathy.  Neurological:     Mental Status: She is alert.            Assessment & Plan:

## 2022-08-25 NOTE — Assessment & Plan Note (Signed)
With the facial swelling--seems like it may be a reaction to something in that sandwich That is better Still mild residual GI symptoms Already on PPI Will give ondansetron for prn use--before eating

## 2022-09-14 ENCOUNTER — Other Ambulatory Visit: Payer: Self-pay | Admitting: Family Medicine

## 2022-09-15 ENCOUNTER — Other Ambulatory Visit: Payer: Self-pay

## 2022-09-15 ENCOUNTER — Other Ambulatory Visit (HOSPITAL_COMMUNITY): Payer: Self-pay

## 2022-09-15 ENCOUNTER — Other Ambulatory Visit: Payer: Self-pay | Admitting: Family Medicine

## 2022-09-15 MED ORDER — AMLODIPINE BESYLATE 10 MG PO TABS
10.0000 mg | ORAL_TABLET | Freq: Every day | ORAL | 1 refills | Status: DC
  Filled 2022-09-15: qty 90, 90d supply, fill #0
  Filled 2023-02-08: qty 90, 90d supply, fill #1

## 2022-09-15 MED ORDER — ESTRADIOL 0.5 MG PO TABS
ORAL_TABLET | ORAL | 1 refills | Status: DC
  Filled 2022-09-15: qty 22, 88d supply, fill #0
  Filled 2023-02-08: qty 22, 88d supply, fill #1
  Filled 2023-05-28: qty 20, 80d supply, fill #2

## 2022-09-15 MED ORDER — METOPROLOL SUCCINATE ER 50 MG PO TB24
50.0000 mg | ORAL_TABLET | Freq: Every day | ORAL | 1 refills | Status: DC
  Filled 2022-09-15: qty 90, 90d supply, fill #0
  Filled 2023-02-08: qty 90, 90d supply, fill #1

## 2022-09-16 ENCOUNTER — Other Ambulatory Visit (HOSPITAL_COMMUNITY): Payer: Self-pay

## 2022-09-16 ENCOUNTER — Other Ambulatory Visit: Payer: Self-pay

## 2022-10-08 ENCOUNTER — Ambulatory Visit (INDEPENDENT_AMBULATORY_CARE_PROVIDER_SITE_OTHER): Payer: PPO | Admitting: Family Medicine

## 2022-10-08 ENCOUNTER — Encounter: Payer: Self-pay | Admitting: Family Medicine

## 2022-10-08 VITALS — BP 122/80 | HR 71 | Temp 97.7°F | Ht 65.25 in | Wt 216.0 lb

## 2022-10-08 DIAGNOSIS — R350 Frequency of micturition: Secondary | ICD-10-CM | POA: Diagnosis not present

## 2022-10-08 LAB — POC URINALSYSI DIPSTICK (AUTOMATED)
Bilirubin, UA: 1
Blood, UA: 200
Glucose, UA: NEGATIVE
Ketones, UA: NEGATIVE
Nitrite, UA: NEGATIVE
Protein, UA: POSITIVE — AB
Spec Grav, UA: 1.025 (ref 1.010–1.025)
Urobilinogen, UA: 0.2 E.U./dL
pH, UA: 6 (ref 5.0–8.0)

## 2022-10-08 MED ORDER — CEPHALEXIN 500 MG PO CAPS: 500.0000 mg | ORAL_CAPSULE | Freq: Three times a day (TID) | ORAL | 0 refills | Status: DC

## 2022-10-08 NOTE — Assessment & Plan Note (Signed)
Acute, likely urinary tract infection given leukocytes in urine and typically symptoms. Will send urine for culture to verify. Treat with Keflex 500 mg p.o. 3 times daily x 7 days Push fluids, consider cranberry.  Discussed ways to prevent urinary tract infections.  Return and ER precautions provided.

## 2022-10-08 NOTE — Progress Notes (Signed)
Patient ID: Claire Lewis, female    DOB: [birth year], 77 y.o.   MRN: [id number]  This visit was conducted in person.  BP 122/80 (BP Location: Left Arm, Patient Position: Sitting, Cuff Size: Normal)   Pulse 71   Temp 97.7 F (36.5 C) (Temporal)   Ht 5' 5.25" (1.657 m)   Wt 216 lb (98 kg)   SpO2 96%   BMI 35.67 kg/m    CC:  Chief Complaint  Patient presents with   Urinary Frequency    And urgency since Monday. Patient also states she gets some burning and pain in arms with urination.      Subjective:   HPI: Claire Lewis is a 77 y.o. female presenting on 10/08/2022 for Urinary Frequency (And urgency since Monday. Patient also states she gets some burning and pain in arms with urination.  )   Urinary Frequency  This is a new problem. The current episode started in the past 7 days. The problem has been gradually worsening. The quality of the pain is described as burning. The pain is at a severity of 5/10. The pain is mild. There has been no fever. She is Not sexually active. There is No history of pyelonephritis. Associated symptoms include frequency and urgency. Pertinent negatives include no chills, discharge, flank pain, hematuria, nausea or vomiting.     Has history of lichen sclerosis   Last Culture  2021 [facility].     Relevant past medical, surgical, family and social history reviewed and updated as indicated. Interim medical history since our last visit reviewed. Allergies and medications reviewed and updated. Outpatient Medications Prior to Visit  Medication Sig Dispense Refill   amLODipine (NORVASC) 10 MG tablet Take 1 tablet (10 mg total) by mouth daily. 90 tablet 1   atorvastatin (LIPITOR) 20 MG tablet Take 1 tablet (20 mg total) by mouth daily. 90 tablet 2   Calcium Carbonate-Vitamin D 600-400 MG-UNIT tablet Take 1 tablet by mouth daily.     estradiol (ESTRACE) 0.5 MG tablet Take 1 tablet by mouth every 4 days 32 tablet 1   halobetasol (ULTRAVATE) 0.05 %  ointment APPLY TO AFFECTED AREA TWICE A DAY 15 g 0   latanoprost (XALATAN) 0.005 % ophthalmic solution 1 drop at bedtime.     metoprolol succinate (TOPROL-XL) 50 MG 24 hr tablet Take 1 tablet (50 mg total) by mouth daily. 90 tablet 1   Omega-3 Fatty Acids (FISH OIL) 1200 MG CAPS Take 1 tablet by mouth daily.     omeprazole (PRILOSEC) 40 MG capsule Take 1 capsule (40 mg total) by mouth daily before breakfast. 90 capsule 1   ondansetron (ZOFRAN) 4 MG tablet Take 1 tablet (4 mg total) by mouth every 8 (eight) hours as needed for nausea or vomiting. 20 tablet 0   No facility-administered medications prior to visit.     Per HPI unless specifically indicated in ROS section below Review of Systems  Constitutional:  Negative for chills.  Gastrointestinal:  Negative for nausea and vomiting.  Genitourinary:  Positive for frequency and urgency. Negative for flank pain and hematuria.   Objective:  BP 122/80 (BP Location: Left Arm, Patient Position: Sitting, Cuff Size: Normal)   Pulse 71   Temp 97.7 F (36.5 C) (Temporal)   Ht 5' 5.25" (1.657 m)   Wt 216 lb (98 kg)   SpO2 96%   BMI 35.67 kg/m   Wt Readings from Last 3 Encounters:  10/08/22 216 lb (98 kg)  08/25/22  217 lb (98.4 kg)  03/11/22 220 lb 8 oz (100 kg)      Physical Exam Constitutional:      General: She is not in acute distress.    Appearance: Normal appearance. She is well-developed. She is not ill-appearing or toxic-appearing.  HENT:     Head: Normocephalic.     Right Ear: Hearing, tympanic membrane, ear canal and external ear normal. Tympanic membrane is not erythematous, retracted or bulging.     Left Ear: Hearing, tympanic membrane, ear canal and external ear normal. Tympanic membrane is not erythematous, retracted or bulging.     Nose: No mucosal edema or rhinorrhea.     Right Sinus: No maxillary sinus tenderness or frontal sinus tenderness.     Left Sinus: No maxillary sinus tenderness or frontal sinus tenderness.      Mouth/Throat:     Pharynx: Uvula midline.  Eyes:     General: Lids are normal. Lids are everted, no foreign bodies appreciated.     Conjunctiva/sclera: Conjunctivae normal.     Pupils: Pupils are equal, round, and reactive to light.  Neck:     Thyroid: No thyroid mass or thyromegaly.     Vascular: No carotid bruit.     Trachea: Trachea normal.  Cardiovascular:     Rate and Rhythm: Normal rate and regular rhythm.     Pulses: Normal pulses.     Heart sounds: Normal heart sounds, S1 normal and S2 normal. No murmur heard.    No friction rub. No gallop.  Pulmonary:     Effort: Pulmonary effort is normal. No tachypnea or respiratory distress.     Breath sounds: Normal breath sounds. No decreased breath sounds, wheezing, rhonchi or rales.  Abdominal:     General: Bowel sounds are normal.     Palpations: Abdomen is soft.     Tenderness: There is no abdominal tenderness. There is no right CVA tenderness or left CVA tenderness.  Musculoskeletal:     Cervical back: Normal range of motion and neck supple.  Skin:    General: Skin is warm and dry.     Findings: No rash.  Neurological:     Mental Status: She is alert.  Psychiatric:        Mood and Affect: Mood is not anxious or depressed.        Speech: Speech normal.        Behavior: Behavior normal. Behavior is cooperative.        Thought Content: Thought content normal.        Judgment: Judgment normal.       Results for orders placed or performed in visit on 10/08/22  POCT Urinalysis Dipstick (Automated)  Result Value Ref Range   Color, UA amber    Clarity, UA cloudy    Glucose, UA Negative Negative   Bilirubin, UA 1 mg/dL    Ketones, UA neg    Spec Grav, UA 1.025 1.010 - 1.025   Blood, UA 200 ery/uL    pH, UA 6.0 5.0 - 8.0   Protein, UA Positive (A) Negative   Urobilinogen, UA 0.2 0.2 or 1.0 E.U./dL   Nitrite, UA neg    Leukocytes, UA Moderate (2+) (A) Negative    Assessment and Plan  Urinary frequency Assessment &  Plan: Acute, likely urinary tract infection given leukocytes in urine and typically symptoms. Will send urine for culture to verify. Treat with Keflex 500 mg p.o. 3 times daily x 7 days Push fluids, consider cranberry.  Discussed ways to prevent urinary tract infections.  Return and ER precautions provided.  Orders: -     POCT Urinalysis Dipstick (Automated) -     Urine Culture  Other orders -     Cephalexin; Take 1 capsule (500 mg total) by mouth 3 (three) times daily.  Dispense: 21 capsule; Refill: 0    No follow-ups on file.   Kerby Nora, MD

## 2022-10-10 LAB — URINE CULTURE
MICRO NUMBER:: 14868053
SPECIMEN QUALITY:: ADEQUATE

## 2022-10-20 ENCOUNTER — Telehealth: Payer: Self-pay | Admitting: Family Medicine

## 2022-10-20 NOTE — Telephone Encounter (Signed)
Contacted Claire Lewis to schedule their annual wellness visit. Appointment made for 11/26/2022.  [facility] AWV TEAM Direct Dial: [phone]

## 2022-11-13 ENCOUNTER — Other Ambulatory Visit: Payer: Self-pay | Admitting: Family Medicine

## 2022-11-13 NOTE — Telephone Encounter (Signed)
Last office visit 10/08/22 for Urinary frequency.  Last refilled 07/03/22 for 15 g with no refills.  Next appt: CPE 03/13/2023.

## 2022-12-01 ENCOUNTER — Ambulatory Visit (INDEPENDENT_AMBULATORY_CARE_PROVIDER_SITE_OTHER): Payer: PPO

## 2022-12-01 VITALS — Ht 67.0 in | Wt 210.0 lb

## 2022-12-01 DIAGNOSIS — Z0000 Encounter for general adult medical examination without abnormal findings: Secondary | ICD-10-CM

## 2022-12-01 NOTE — Patient Instructions (Addendum)
Claire Lewis , Thank you for taking time to come for your Medicare Wellness Visit. I appreciate your ongoing commitment to your health goals. Please review the following plan we discussed and let me know if I can assist you in the future.   These are the goals we discussed:  Goals      Cut out extra servings     Pt wishes to stay healthy.     Increase physical activity     Starting 01/26/2018, I will continue to exercise for at least 30 min 3 days a week.       Patient Stated     02/29/2020, I will maintain and continue medications as prescribed.      Patient Stated     Get off some medication, exercise more, drink more water and eat healthy.        This is a list of the screening recommended for you and due dates:  Health Maintenance  Topic Date Due   Zoster (Shingles) Vaccine (1 of 2) Never done   DTaP/Tdap/Td vaccine (2 - Tdap) 06/20/2019   Flu Shot  01/15/2023   Colon Cancer Screening  04/30/2023   Mammogram  08/21/2023   Medicare Annual Wellness Visit  12/01/2023   DEXA scan (bone density measurement)  08/21/2027   Pneumonia Vaccine  Completed   COVID-19 Vaccine  Completed   Hepatitis C Screening  Completed   HPV Vaccine  Aged Out    Advanced directives: in chart.  Conditions/risks identified: Aim for 30 minutes of exercise or brisk walking, 6-8 glasses of water, and 5 servings of fruits and vegetables each day.   Next appointment: Follow up in one year for your annual wellness visit 12/02/22 @ 9:15 televisit    Preventive Care 65 Years and Older, Female Preventive care refers to lifestyle choices and visits with your health care provider that can promote health and wellness. What does preventive care include? A yearly physical exam. This is also called an annual well check. Dental exams once or twice a year. Routine eye exams. Ask your health care provider how often you should have your eyes checked. Personal lifestyle choices, including: Daily care of your  teeth and gums. Regular physical activity. Eating a healthy diet. Avoiding tobacco and drug use. Limiting alcohol use. Practicing safe sex. Taking low-dose aspirin every day. Taking vitamin and mineral supplements as recommended by your health care provider. What happens during an annual well check? The services and screenings done by your health care provider during your annual well check will depend on your age, overall health, lifestyle risk factors, and family history of disease. Counseling  Your health care provider may ask you questions about your: Alcohol use. Tobacco use. Drug use. Emotional well-being. Home and relationship well-being. Sexual activity. Eating habits. History of falls. Memory and ability to understand (cognition). Work and work Astronomer. Reproductive health. Screening  You may have the following tests or measurements: Height, weight, and BMI. Blood pressure. Lipid and cholesterol levels. These may be checked every 5 years, or more frequently if you are over 76 years old. Skin check. Lung cancer screening. You may have this screening every year starting at age 53 if you have a 30-pack-year history of smoking and currently smoke or have quit within the past 15 years. Fecal occult blood test (FOBT) of the stool. You may have this test every year starting at age 37. Flexible sigmoidoscopy or colonoscopy. You may have a sigmoidoscopy every 5 years or a colonoscopy  every 10 years starting at age 45. Hepatitis C blood test. Hepatitis B blood test. Sexually transmitted disease (STD) testing. Diabetes screening. This is done by checking your blood sugar (glucose) after you have not eaten for a while (fasting). You may have this done every 1-3 years. Bone density scan. This is done to screen for osteoporosis. You may have this done starting at age 39. Mammogram. This may be done every 1-2 years. Talk to your health care provider about how often you should have  regular mammograms. Talk with your health care provider about your test results, treatment options, and if necessary, the need for more tests. Vaccines  Your health care provider may recommend certain vaccines, such as: Influenza vaccine. This is recommended every year. Tetanus, diphtheria, and acellular pertussis (Tdap, Td) vaccine. You may need a Td booster every 10 years. Zoster vaccine. You may need this after age 27. Pneumococcal 13-valent conjugate (PCV13) vaccine. One dose is recommended after age 64. Pneumococcal polysaccharide (PPSV23) vaccine. One dose is recommended after age 35. Talk to your health care provider about which screenings and vaccines you need and how often you need them. This information is not intended to replace advice given to you by your health care provider. Make sure you discuss any questions you have with your health care provider. Document Released: 06/29/2015 Document Revised: 02/20/2016 Document Reviewed: 04/03/2015 Elsevier Interactive Patient Education  2017 [place] Prevention in the Home Falls can cause injuries. They can happen to people of all ages. There are many things you can do to make your home safe and to help prevent falls. What can I do on the outside of my home? Regularly fix the edges of walkways and driveways and fix any cracks. Remove anything that might make you trip as you walk through a door, such as a raised step or threshold. Trim any bushes or trees on the path to your home. Use bright outdoor lighting. Clear any walking paths of anything that might make someone trip, such as rocks or tools. Regularly check to see if handrails are loose or broken. Make sure that both sides of any steps have handrails. Any raised decks and porches should have guardrails on the edges. Have any leaves, snow, or ice cleared regularly. Use sand or salt on walking paths during winter. Clean up any spills in your garage right away. This  includes oil or grease spills. What can I do in the bathroom? Use night lights. Install grab bars by the toilet and in the tub and shower. Do not use towel bars as grab bars. Use non-skid mats or decals in the tub or shower. If you need to sit down in the shower, use a plastic, non-slip stool. Keep the floor dry. Clean up any water that spills on the floor as soon as it happens. Remove soap buildup in the tub or shower regularly. Attach bath mats securely with double-sided non-slip rug tape. Do not have throw rugs and other things on the floor that can make you trip. What can I do in the bedroom? Use night lights. Make sure that you have a light by your bed that is easy to reach. Do not use any sheets or blankets that are too big for your bed. They should not hang down onto the floor. Have a firm chair that has side arms. You can use this for support while you get dressed. Do not have throw rugs and other things on the floor that can make  you trip. What can I do in the kitchen? Clean up any spills right away. Avoid walking on wet floors. Keep items that you use a lot in easy-to-reach places. If you need to reach something above you, use a strong step stool that has a grab bar. Keep electrical cords out of the way. Do not use floor polish or wax that makes floors slippery. If you must use wax, use non-skid floor wax. Do not have throw rugs and other things on the floor that can make you trip. What can I do with my stairs? Do not leave any items on the stairs. Make sure that there are handrails on both sides of the stairs and use them. Fix handrails that are broken or loose. Make sure that handrails are as long as the stairways. Check any carpeting to make sure that it is firmly attached to the stairs. Fix any carpet that is loose or worn. Avoid having throw rugs at the top or bottom of the stairs. If you do have throw rugs, attach them to the floor with carpet tape. Make sure that you  have a light switch at the top of the stairs and the bottom of the stairs. If you do not have them, ask someone to add them for you. What else can I do to help prevent falls? Wear shoes that: Do not have high heels. Have rubber bottoms. Are comfortable and fit you well. Are closed at the toe. Do not wear sandals. If you use a stepladder: Make sure that it is fully opened. Do not climb a closed stepladder. Make sure that both sides of the stepladder are locked into place. Ask someone to hold it for you, if possible. Clearly mark and make sure that you can see: Any grab bars or handrails. First and last steps. Where the edge of each step is. Use tools that help you move around (mobility aids) if they are needed. These include: Canes. Walkers. Scooters. Crutches. Turn on the lights when you go into a dark area. Replace any light bulbs as soon as they burn out. Set up your furniture so you have a clear path. Avoid moving your furniture around. If any of your floors are uneven, fix them. If there are any pets around you, be aware of where they are. Review your medicines with your doctor. Some medicines can make you feel dizzy. This can increase your chance of falling. Ask your doctor what other things that you can do to help prevent falls. This information is not intended to replace advice given to you by your health care provider. Make sure you discuss any questions you have with your health care provider. Document Released: 03/29/2009 Document Revised: 11/08/2015 Document Reviewed: 07/07/2014 Elsevier Interactive Patient Education  2017 [employer].

## 2022-12-01 NOTE — Progress Notes (Signed)
I connected with  Claire Lewis on 12/01/22 by a audio enabled telemedicine application and verified that I am speaking with the correct person using two identifiers.  Patient Location: Home  Provider Location: Office/Clinic  I discussed the limitations of evaluation and management by telemedicine. The patient expressed understanding and agreed to proceed.  Subjective:   Claire Lewis is a 77 y.o. female who presents for Medicare Annual (Subsequent) preventive examination.  Review of Systems      Cardiac Risk Factors include: advanced age (>38men, >72 women);sedentary lifestyle;hypertension     Objective:    Today's Vitals   12/01/22 0914  Weight: 210 lb (95.3 kg)  Height: 5\' 7"  (1.702 m)   Body mass index is 32.89 kg/m.     12/01/2022    9:25 AM 02/29/2020    8:20 AM 01/26/2018    8:40 AM 01/14/2017    8:13 AM 01/09/2016    9:13 AM  Advanced Directives  Does Patient Have a Medical Advance Directive? Yes No No No No  Type of Estate agent of [place];Living will      Does patient want to make changes to medical advance directive? No - Patient declined      Copy of Healthcare Power of Attorney in Chart? Yes - validated most recent copy scanned in chart (See row information)      Would patient like information on creating a medical advance directive?  No - Patient declined No - Patient declined No - Patient declined No - patient declined information    Current Medications (verified) Outpatient Encounter Medications as of 12/01/2022  Medication Sig   amLODipine (NORVASC) 10 MG tablet Take 1 tablet (10 mg total) by mouth daily.   atorvastatin (LIPITOR) 20 MG tablet Take 1 tablet (20 mg total) by mouth daily.   Calcium Carbonate-Vitamin D 600-400 MG-UNIT tablet Take 1 tablet by mouth daily.   estradiol (ESTRACE) 0.5 MG tablet Take 1 tablet by mouth every 4 days   halobetasol (ULTRAVATE) 0.05 % ointment APPLY TO AFFECTED AREA TWICE A DAY   latanoprost  (XALATAN) 0.005 % ophthalmic solution 1 drop at bedtime.   metoprolol succinate (TOPROL-XL) 50 MG 24 hr tablet Take 1 tablet (50 mg total) by mouth daily.   Omega-3 Fatty Acids (FISH OIL) 1200 MG CAPS Take 1 tablet by mouth daily.   omeprazole (PRILOSEC) 40 MG capsule Take 1 capsule (40 mg total) by mouth daily before breakfast.   cephALEXin (KEFLEX) 500 MG capsule Take 1 capsule (500 mg total) by mouth 3 (three) times daily. (Patient not taking: Reported on 12/01/2022)   No facility-administered encounter medications on file as of 12/01/2022.    Allergies (verified) Patient has no known allergies.   History: Past Medical History:  Diagnosis Date   Allergy    GERD (gastroesophageal reflux disease)    Hypertension    Past Surgical History:  Procedure Laterality Date   CATARACT EXTRACTION, BILATERAL  04/2019   PARTIAL HYSTERECTOMY  1983   one ovary remains for endometriosis   Family History  Problem Relation Age of Onset   Heart attack Father 50   Coronary artery disease Father    Cancer Mother        breast   Dementia Mother    Breast cancer Mother    Cancer Brother        colon   Cancer Paternal Grandmother        lung   Breast cancer Paternal Grandmother  Meniere's disease Daughter    Social History   Socioeconomic History   Marital status: Married    Spouse name: Not on file   Number of children: Not on file   Years of education: Not on file   Highest education level: Not on file  Occupational History   Occupation: retired from Theatre manager: UNEMPLOYED  Tobacco Use   Smoking status: Never   Smokeless tobacco: Never  Vaping Use   Vaping Use: Never used  Substance and Sexual Activity   Alcohol use: No   Drug use: No   Sexual activity: Never  Other Topics Concern   Not on file  Social History Narrative   Regular exercise---walks 3-4 times a week   Diet: fruits and veggies, no fried foods, on weight watchers                  Social  Determinants of Health   Financial Resource Strain: Low Risk  (12/01/2022)   Overall Financial Resource Strain (CARDIA)    Difficulty of Paying Living Expenses: Not hard at all  Food Insecurity: No Food Insecurity (12/01/2022)   Hunger Vital Sign    Worried About Running Out of Food in the Last Year: Never true    Ran Out of Food in the Last Year: Never true  Transportation Needs: No Transportation Needs (03/02/2021)   PRAPARE - Administrator, Civil Service (Medical): No    Lack of Transportation (Non-Medical): No  Physical Activity: Insufficiently Active (12/01/2022)   Exercise Vital Sign    Days of Exercise per Week: 3 days    Minutes of Exercise per Session: 20 min  Stress: No Stress Concern Present (12/01/2022)   [employer] of Occupational Health - Occupational Stress Questionnaire    Feeling of Stress : Not at all  Social Connections: Socially Integrated (12/01/2022)   Social Connection and Isolation Panel [NHANES]    Frequency of Communication with Friends and Family: More than three times a week    Frequency of Social Gatherings with Friends and Family: More than three times a week    Attends Religious Services: More than 4 times per year    Active Member of [employer] or Organizations: Yes    Attends Engineer, structural: More than 4 times per year    Marital Status: Married    Tobacco Counseling Counseling given: Not Answered   Clinical Intake:  Pre-visit preparation completed: Yes  Pain : No/denies pain     Nutritional Risks: None Diabetes: No  How often do you need to have someone help you when you read instructions, pamphlets, or other written materials from your doctor or pharmacy?: 1 - Never  Diabetic? no  Interpreter Needed?: No  Information entered by :: C.Juleah Paradise LPN   Activities of Daily Living    12/01/2022    9:26 AM 11/26/2022    3:35 PM  In your present state of health, do you have any difficulty performing the following  activities:  Hearing? 0 0  Vision? 0 0  Difficulty concentrating or making decisions? 0 0  Walking or climbing stairs? 0 0  Dressing or bathing? 0 0  Doing errands, shopping? 0 0  Preparing Food and eating ? N N  Using the Toilet? N N  In the past six months, have you accidently leaked urine? N N  Do you have problems with loss of bowel control? N N  Managing your Medications? N N  Managing your  Finances? N   Housekeeping or managing your Housekeeping? N N    Patient Care Team: Excell Seltzer, MD as PCP - General Kyla Balzarine as Consulting Physician (Dentistry) Kathyrn Sheriff, [facility] as Pharmacist (Pharmacist)  Indicate any recent Medical Services you may have received from other than [facility] providers in the past year (date may be approximate).     Assessment:   This is a routine wellness examination for Claire Lewis.  Hearing/Vision screen Hearing Screening - Comments:: No hearing issues Vision Screening - Comments:: Readers-Dr.Scott  Dietary issues and exercise activities discussed: Current Exercise Habits: Home exercise routine, Type of exercise: walking, Time (Minutes): 20, Frequency (Times/Week): 3, Weekly Exercise (Minutes/Week): 60, Intensity: Mild, Exercise limited by: None identified   Goals Addressed             This Visit's Progress    Patient Stated       Get off some medication, exercise more, drink more water and eat healthy.       Depression Screen    12/01/2022    9:24 AM 10/08/2022   11:33 AM 03/11/2022    9:39 AM 03/02/2021    9:00 AM 02/29/2020    8:22 AM 02/08/2019   10:12 AM 01/26/2018    8:59 AM  PHQ 2/9 Scores  PHQ - 2 Score 0 0 0 0 0 0 0  PHQ- 9 Score 0 0   0  0    Fall Risk    12/01/2022    9:25 AM 11/26/2022    3:35 PM 10/08/2022   11:32 AM 03/11/2022    9:39 AM 03/02/2021    9:03 AM  Fall Risk   Falls in the past year? 0 0 0 0 0  Number falls in past yr: 0 0 0  0  Injury with Fall? 0  0  0  Risk for fall due to : No Fall Risks  No Fall  Risks  No Fall Risks  Follow up Falls prevention discussed;Falls evaluation completed  Falls evaluation completed  Falls evaluation completed    FALL RISK PREVENTION PERTAINING TO THE HOME:  Any stairs in or around the home? Yes  If so, are there any without handrails? No  Home free of loose throw rugs in walkways, pet beds, electrical cords, etc? Yes  Adequate lighting in your home to reduce risk of falls? Yes   ASSISTIVE DEVICES UTILIZED TO PREVENT FALLS:  Life alert? No  Use of a cane, walker or w/c? No  Grab bars in the bathroom? No  Shower chair or bench in shower? Yes  Elevated toilet seat or a handicapped toilet? No    Cognitive Function:    02/29/2020    8:27 AM 01/26/2018    8:59 AM 01/14/2017    8:15 AM 01/09/2016    9:42 AM  MMSE - Mini Mental State Exam  Orientation to time 5 5 5 5   Orientation to Place 5 5 5 5   Registration 3 3 3 3   Attention/ Calculation 5 0 0 0  Recall 3 3 3 3   Language- name 2 objects  0 0 0  Language- repeat 1 1 1 1   Language- follow 3 step command  3 3 3   Language- read & follow direction  0 0 0  Write a sentence  0 0 0  Copy design  0 0 0  Total score  20 20 20         12/01/2022    9:26 AM 03/02/2021  9:03 AM  6CIT Screen  What Year? 0 points 0 points  What month? 0 points 0 points  What time? 0 points 0 points  Count back from 20 0 points 0 points  Months in reverse 0 points 2 points  Repeat phrase 0 points 2 points  Total Score 0 points 4 points    Immunizations Immunization History  Administered Date(s) Administered   COVID-19, mRNA, vaccine(Comirnaty)12 years and older 03/25/2022   Fluad Quad(high Dose 65+) 02/08/2019, 03/01/2020, 03/08/2021, 03/11/2022   Influenza Whole 04/16/2010   Influenza, High Dose Seasonal PF 06/15/2015, 04/13/2018   PFIZER(Purple Top)SARS-COV-2 Vaccination 07/23/2019, 08/13/2019, 04/13/2020, 01/24/2021   Pneumococcal Conjugate-13 11/07/2014   Pneumococcal Polysaccharide-23 10/04/2013   Td  06/19/2009   Zoster, Live 05/16/2008    TDAP status: Due, Education has been provided regarding the importance of this vaccine. Advised may receive this vaccine at local pharmacy or Health Dept. Aware to provide a copy of the vaccination record if obtained from local pharmacy or Health Dept. Verbalized acceptance and understanding.  Flu Vaccine status: Up to date  Pneumococcal vaccine status: Up to date  Covid-19 vaccine status: Completed vaccines  Qualifies for Shingles Vaccine? Yes   Zostavax completed Yes   Shingrix Completed?: No.    Education has been provided regarding the importance of this vaccine. Patient has been advised to call insurance company to determine out of pocket expense if they have not yet received this vaccine. Advised may also receive vaccine at local pharmacy or Health Dept. Verbalized acceptance and understanding.  Screening Tests Health Maintenance  Topic Date Due   Zoster Vaccines- Shingrix (1 of 2) Never done   DTaP/Tdap/Td (2 - Tdap) 06/20/2019   INFLUENZA VACCINE  01/15/2023   Colonoscopy  04/30/2023   MAMMOGRAM  08/21/2023   Medicare Annual Wellness (AWV)  12/01/2023   DEXA SCAN  08/21/2027   Pneumonia Vaccine 50+ Years old  Completed   COVID-19 Vaccine  Completed   Hepatitis C Screening  Completed   HPV VACCINES  Aged Out    Health Maintenance  Health Maintenance Due  Topic Date Due   Zoster Vaccines- Shingrix (1 of 2) Never done   DTaP/Tdap/Td (2 - Tdap) 06/20/2019    Colorectal cancer screening: Type of screening: Colonoscopy. Completed 04/29/18. Repeat every 5 years already scheduled per pt.  Mammogram status: Completed 08/21/22. Repeat every year  Bone Density status: Completed 02/21/23. Results reflect: Bone density results: OSTEOPENIA. Repeat every 2 years.  Lung Cancer Screening: (Low Dose CT Chest recommended if Age 28-80 years, 30 pack-year currently smoking OR have quit w/in 15years.) does not qualify.   Lung Cancer Screening  Referral: no  Additional Screening:  Hepatitis C Screening: does qualify; Completed 01/01/16  Vision Screening: Recommended annual ophthalmology exams for early detection of glaucoma and other disorders of the eye. Is the patient up to date with their annual eye exam?  Yes  Who is the provider or what is the name of the office in which the patient attends annual eye exams? Dr.Scott If pt is not established with a provider, would they like to be referred to a provider to establish care? Yes .   Dental Screening: Recommended annual dental exams for proper oral hygiene  Community Resource Referral / Chronic Care Management: CRR required this visit?  Yes   CCM required this visit?  Yes      Plan:     I have personally reviewed and noted the following in the patient's chart:   Medical  and social history Use of alcohol, tobacco or illicit drugs  Current medications and supplements including opioid prescriptions. Patient is not currently taking opioid prescriptions. Functional ability and status Nutritional status Physical activity Advanced directives List of other physicians Hospitalizations, surgeries, and ER visits in previous 12 months Vitals Screenings to include cognitive, depression, and falls Referrals and appointments  In addition, I have reviewed and discussed with patient certain preventive protocols, quality metrics, and best practice recommendations. A written personalized care plan for preventive services as well as general preventive health recommendations were provided to patient.     Maryan Puls, LPN   1/61/0960   Nurse Notes: none

## 2022-12-11 ENCOUNTER — Other Ambulatory Visit (HOSPITAL_COMMUNITY): Payer: Self-pay

## 2022-12-24 DIAGNOSIS — H401131 Primary open-angle glaucoma, bilateral, mild stage: Secondary | ICD-10-CM | POA: Diagnosis not present

## 2022-12-29 ENCOUNTER — Other Ambulatory Visit (HOSPITAL_COMMUNITY): Payer: Self-pay

## 2022-12-29 DIAGNOSIS — R1310 Dysphagia, unspecified: Secondary | ICD-10-CM | POA: Diagnosis not present

## 2022-12-29 DIAGNOSIS — Z86010 Personal history of colonic polyps: Secondary | ICD-10-CM | POA: Diagnosis not present

## 2022-12-29 DIAGNOSIS — Z800 Family history of malignant neoplasm of digestive organs: Secondary | ICD-10-CM | POA: Diagnosis not present

## 2022-12-29 DIAGNOSIS — K219 Gastro-esophageal reflux disease without esophagitis: Secondary | ICD-10-CM | POA: Diagnosis not present

## 2022-12-29 MED ORDER — OMEPRAZOLE 40 MG PO CPDR
40.0000 mg | DELAYED_RELEASE_CAPSULE | Freq: Every day | ORAL | 4 refills | Status: DC
  Filled 2022-12-29 – 2023-04-11 (×3): qty 90, 90d supply, fill #0
  Filled 2023-07-06 (×2): qty 90, 90d supply, fill #1
  Filled 2023-10-06: qty 90, 90d supply, fill #2
  Filled 2023-11-26: qty 90, 90d supply, fill #3

## 2023-01-29 ENCOUNTER — Encounter (INDEPENDENT_AMBULATORY_CARE_PROVIDER_SITE_OTHER): Payer: Self-pay

## 2023-02-08 ENCOUNTER — Other Ambulatory Visit (HOSPITAL_COMMUNITY): Payer: Self-pay

## 2023-02-09 ENCOUNTER — Other Ambulatory Visit: Payer: Self-pay

## 2023-02-19 ENCOUNTER — Telehealth: Payer: Self-pay | Admitting: *Deleted

## 2023-02-19 DIAGNOSIS — R7303 Prediabetes: Secondary | ICD-10-CM

## 2023-02-19 DIAGNOSIS — E7800 Pure hypercholesterolemia, unspecified: Secondary | ICD-10-CM

## 2023-02-19 NOTE — Telephone Encounter (Signed)
-----   Message from Alvina Chou sent at 02/18/2023  3:37 PM EDT ----- Regarding: Lab orders for Fri, 9.20.24 Patient is scheduled for CPX labs, please order future labs, Thanks , Camelia Eng

## 2023-03-06 ENCOUNTER — Other Ambulatory Visit (INDEPENDENT_AMBULATORY_CARE_PROVIDER_SITE_OTHER): Payer: PPO

## 2023-03-06 DIAGNOSIS — E7800 Pure hypercholesterolemia, unspecified: Secondary | ICD-10-CM

## 2023-03-06 DIAGNOSIS — R7303 Prediabetes: Secondary | ICD-10-CM

## 2023-03-06 LAB — LIPID PANEL
Cholesterol: 135 mg/dL (ref 0–200)
HDL: 46.2 mg/dL (ref >39.00)
LDL Cholesterol: 68 mg/dL (ref 0–99)
NonHDL: 88.58
Total CHOL/HDL Ratio: 3
Triglycerides: 101 mg/dL (ref 0.0–149.0)
VLDL: 20.2 mg/dL (ref 0.0–40.0)

## 2023-03-06 LAB — COMPREHENSIVE METABOLIC PANEL
ALT: 19 U/L (ref 0–35)
AST: 24 U/L (ref 0–37)
Albumin: 3.8 g/dL (ref 3.5–5.2)
Alkaline Phosphatase: 85 U/L (ref 39–117)
BUN: 18 mg/dL (ref 6–23)
CO2: 27 mEq/L (ref 19–32)
Calcium: 9 mg/dL (ref 8.4–10.5)
Chloride: 106 mEq/L (ref 96–112)
Creatinine, Ser: 0.97 mg/dL (ref 0.40–1.20)
GFR: 56.35 mL/min — ABNORMAL LOW (ref >60.00)
Glucose, Bld: 115 mg/dL — ABNORMAL HIGH (ref 70–99)
Potassium: 4 mEq/L (ref 3.5–5.1)
Sodium: 141 mEq/L (ref 135–145)
Total Bilirubin: 0.6 mg/dL (ref 0.2–1.2)
Total Protein: 6.6 g/dL (ref 6.0–8.3)

## 2023-03-06 LAB — HEMOGLOBIN A1C: Hgb A1c MFr Bld: 5.8 % (ref 4.6–6.5)

## 2023-03-06 NOTE — Progress Notes (Signed)
No critical labs need to be addressed urgently. We will discuss labs in detail at upcoming office visit.

## 2023-03-13 ENCOUNTER — Encounter: Payer: PPO | Admitting: Family Medicine

## 2023-04-11 ENCOUNTER — Other Ambulatory Visit (HOSPITAL_COMMUNITY): Payer: Self-pay

## 2023-04-13 ENCOUNTER — Other Ambulatory Visit: Payer: Self-pay

## 2023-04-22 ENCOUNTER — Encounter: Payer: Self-pay | Admitting: Family Medicine

## 2023-04-22 ENCOUNTER — Ambulatory Visit (INDEPENDENT_AMBULATORY_CARE_PROVIDER_SITE_OTHER): Payer: PPO | Admitting: Family Medicine

## 2023-04-22 VITALS — BP 122/62 | HR 72 | Temp 98.2°F | Ht 67.0 in | Wt 215.0 lb

## 2023-04-22 DIAGNOSIS — E66811 Obesity, class 1: Secondary | ICD-10-CM

## 2023-04-22 DIAGNOSIS — R7303 Prediabetes: Secondary | ICD-10-CM | POA: Diagnosis not present

## 2023-04-22 DIAGNOSIS — I10 Essential (primary) hypertension: Secondary | ICD-10-CM

## 2023-04-22 DIAGNOSIS — Z0000 Encounter for general adult medical examination without abnormal findings: Secondary | ICD-10-CM | POA: Diagnosis not present

## 2023-04-22 DIAGNOSIS — E7800 Pure hypercholesterolemia, unspecified: Secondary | ICD-10-CM

## 2023-04-22 DIAGNOSIS — Z23 Encounter for immunization: Secondary | ICD-10-CM | POA: Diagnosis not present

## 2023-04-22 NOTE — Assessment & Plan Note (Signed)
Stable, chronic.  Continue current medication. ? ? ?Atorvastatin 20 mg daily ?

## 2023-04-22 NOTE — Progress Notes (Signed)
Patient ID: Claire Lewis, female    DOB: [birth year], 77 y.o.   MRN: [id number]  This visit was conducted in person.  BP 122/62 (BP Location: Left Arm, Patient Position: Sitting, Cuff Size: Normal)   Pulse 72   Temp 98.2 F (36.8 C) (Oral)   Ht 5\' 7"  (1.702 m)   Wt 215 lb (97.5 kg)   SpO2 96%   BMI 33.67 kg/m    CC:  Chief Complaint  Patient presents with   Medicare Wellness    Part 2    Subjective:   HPI: Claire Lewis is a 77 y.o. female presenting on 04/22/2023 for Medicare Wellness (Part 2) The patient presents for complete physical and review of chronic health problems. He/She also has the following acute concerns today: none  The patient saw a LPN or RN for medicare wellness visit. 12/01/2022  Prevention and wellness was reviewed in detail. Note reviewed and important notes copied below.  Flowsheet Row Office Visit from 04/22/2023 in [facility]  PHQ-2 Total Score 0      Hypertension:  Blood pressure at goal on amlodipine 10 mg p.o. daily, metoprolol XL 50 mg daily BP Readings from Last 3 Encounters:  04/22/23 122/62  10/08/22 122/80  08/25/22 136/72  Using medication without problems or lightheadedness:  none Chest pain with exertion: none Edema:none Short of breath: none Average home BPs: Other issues:    Renal function decreased, but better..  still not getting much water.   prediabetes  Lab Results  Component Value Date   HGBA1C 5.8 03/06/2023    Elevated Cholesterol: LDL at goal on atorvastatin 20 mg p.o. daily Lab Results  Component Value Date   CHOL 135 03/06/2023   HDL 46.20 03/06/2023   LDLCALC 68 03/06/2023   TRIG 101.0 03/06/2023   CHOLHDL 3 03/06/2023  Using medications without problems: none Muscle aches:  none Diet compliance: moderate, minimal water  Exercise: walking 2-3 times per week Other complaints:       Relevant past medical, surgical, family and social history reviewed and  updated as indicated. Interim medical history since our last visit reviewed. Allergies and medications reviewed and updated. Outpatient Medications Prior to Visit  Medication Sig Dispense Refill   amLODipine (NORVASC) 10 MG tablet Take 1 tablet (10 mg total) by mouth daily. 90 tablet 1   atorvastatin (LIPITOR) 20 MG tablet Take 1 tablet (20 mg total) by mouth daily. 90 tablet 2   Calcium Carbonate-Vitamin D 600-400 MG-UNIT tablet Take 1 tablet by mouth daily.     estradiol (ESTRACE) 0.5 MG tablet Take 1 tablet by mouth every 4 days 32 tablet 1   halobetasol (ULTRAVATE) 0.05 % ointment APPLY TO AFFECTED AREA TWICE A DAY 15 g 0   latanoprost (XALATAN) 0.005 % ophthalmic solution 1 drop at bedtime.     metoprolol succinate (TOPROL-XL) 50 MG 24 hr tablet Take 1 tablet (50 mg total) by mouth daily. 90 tablet 1   Omega-3 Fatty Acids (FISH OIL) 1200 MG CAPS Take 1 tablet by mouth daily.     omeprazole (PRILOSEC) 40 MG capsule Take 1 capsule (40 mg total) by mouth daily. 90 capsule 4   cephALEXin (KEFLEX) 500 MG capsule Take 1 capsule (500 mg total) by mouth 3 (three) times daily. (Patient not taking: Reported on 12/01/2022) 21 capsule 0   No facility-administered medications prior to visit.     Per HPI unless specifically indicated in ROS section below  Review of Systems  Constitutional:  Negative for fatigue and fever.  HENT:  Negative for congestion.   Eyes:  Negative for pain.  Respiratory:  Negative for cough and shortness of breath.   Cardiovascular:  Negative for chest pain, palpitations and leg swelling.  Gastrointestinal:  Negative for abdominal pain.  Genitourinary:  Negative for dysuria and vaginal bleeding.  Musculoskeletal:  Negative for back pain.  Neurological:  Negative for syncope, light-headedness and headaches.  Psychiatric/Behavioral:  Negative for dysphoric mood.    Objective:  BP 122/62 (BP Location: Left Arm, Patient Position: Sitting, Cuff Size: Normal)   Pulse 72    Temp 98.2 F (36.8 C) (Oral)   Ht 5\' 7"  (1.702 m)   Wt 215 lb (97.5 kg)   SpO2 96%   BMI 33.67 kg/m   Wt Readings from Last 3 Encounters:  04/22/23 215 lb (97.5 kg)  12/01/22 210 lb (95.3 kg)  10/08/22 216 lb (98 kg)      Physical Exam Vitals and nursing note reviewed.  Constitutional:      General: She is not in acute distress.    Appearance: Normal appearance. She is well-developed. She is obese. She is not ill-appearing or toxic-appearing.  HENT:     Head: Normocephalic.     Right Ear: Hearing, tympanic membrane, ear canal and external ear normal.     Left Ear: Hearing, tympanic membrane, ear canal and external ear normal.     Nose: Nose normal.  Eyes:     General: Lids are normal. Lids are everted, no foreign bodies appreciated.     Conjunctiva/sclera: Conjunctivae normal.     Pupils: Pupils are equal, round, and reactive to light.  Neck:     Thyroid: No thyroid mass or thyromegaly.     Vascular: No carotid bruit.     Trachea: Trachea normal.  Cardiovascular:     Rate and Rhythm: Normal rate and regular rhythm.     Heart sounds: Normal heart sounds, S1 normal and S2 normal. No murmur heard.    No gallop.  Pulmonary:     Effort: Pulmonary effort is normal. No respiratory distress.     Breath sounds: Normal breath sounds. No wheezing, rhonchi or rales.  Abdominal:     General: Bowel sounds are normal. There is no distension or abdominal bruit.     Palpations: Abdomen is soft. There is no fluid wave or mass.     Tenderness: There is no abdominal tenderness. There is no guarding or rebound.     Hernia: No hernia is present.  Musculoskeletal:     Cervical back: Normal range of motion and neck supple.  Lymphadenopathy:     Cervical: No cervical adenopathy.  Skin:    General: Skin is warm and dry.     Findings: No rash.  Neurological:     Mental Status: She is alert.     Cranial Nerves: No cranial nerve deficit.     Sensory: No sensory deficit.  Psychiatric:         Mood and Affect: Mood is not anxious or depressed.        Speech: Speech normal.        Behavior: Behavior normal. Behavior is cooperative.        Judgment: Judgment normal.       Results for orders placed or performed in visit on 03/06/23  Lipid panel  Result Value Ref Range   Cholesterol 135 0 - 200 mg/dL   Triglycerides 956.2 0.0 -  149.0 mg/dL   HDL 52.84 >13.24 mg/dL   VLDL 40.1 0.0 - 02.7 mg/dL   LDL Cholesterol 68 0 - 99 mg/dL   Total CHOL/HDL Ratio 3    NonHDL 88.58   Hemoglobin A1c  Result Value Ref Range   Hgb A1c MFr Bld 5.8 4.6 - 6.5 %  Comprehensive metabolic panel  Result Value Ref Range   Sodium 141 135 - 145 mEq/L   Potassium 4.0 3.5 - 5.1 mEq/L   Chloride 106 96 - 112 mEq/L   CO2 27 19 - 32 mEq/L   Glucose, Bld 115 (H) 70 - 99 mg/dL   BUN 18 6 - 23 mg/dL   Creatinine, Ser 2.53 0.40 - 1.20 mg/dL   Total Bilirubin 0.6 0.2 - 1.2 mg/dL   Alkaline Phosphatase 85 39 - 117 U/L   AST 24 0 - 37 U/L   ALT 19 0 - 35 U/L   Total Protein 6.6 6.0 - 8.3 g/dL   Albumin 3.8 3.5 - 5.2 g/dL   GFR 66.44 (L) >03.47 mL/min   Calcium 9.0 8.4 - 10.5 mg/dL     COVID 19 screen:  No recent travel or known exposure to COVID19 The patient denies respiratory symptoms of COVID 19 at this time. The importance of social distancing was discussed today.   Assessment and Plan   The patient's preventative maintenance and recommended screening tests for an annual wellness exam were reviewed in full today. Brought up to date unless services declined.  Counselled on the importance of diet, exercise, and its role in overall health and mortality. The patient's FH and SH was reviewed, including their home life, tobacco status, and drug and alcohol status.   Vaccines: Uptodate with PNA,consider shingles, given high dose flu today. Refused td. S/P COVID19 vaccine series x 5. DEXA: Stable, normal 03/2016, On HRT.   Repeat 08/2021: osteopenia, repeat in 2-5 years. On Ca vit d. Mammogram:  08/2022 nml,  MGM, mother with breast cancer. DVE/PAP : TAH but has one ovary...  No symptoms, no family histroy of ovarian    cancer.    Does have lichen sclerosis.  No need for DVE, pt agreeable Colon: 2019 polyps, family history colon cancer brother, repeat in 5 years, Dr. Elnoria Howard.  Nonsmoker.  Hep c: done  Problem List Items Addressed This Visit     Essential hypertension, benign    Stable, chronic.  Continue current medication.   amlodipine 10 mg p.o. daily, metoprolol XL 50 mg daily      Obesity (BMI 30.0-34.9)    Encouraged exercise, weight loss, healthy eating habits.       Prediabetes    Encouraged exercise, weight loss, low carb diet.       Pure hypercholesterolemia    Stable, chronic.  Continue current medication.   Atorvastatin 20 mg daily      Other Visit Diagnoses     Routine general medical examination at a health care facility    -  Primary   Encounter for immunization       Relevant Orders   Flu Vaccine Trivalent High Dose (Fluad) (Completed)          Kerby Nora, MD

## 2023-04-22 NOTE — Assessment & Plan Note (Signed)
Encouraged exercise, weight loss, healthy eating habits. ? ?

## 2023-04-22 NOTE — Patient Instructions (Addendum)
Work on increasing exercise  3-5 days a week, low carb diet and wegiht loss.

## 2023-04-22 NOTE — Assessment & Plan Note (Signed)
Encouraged exercise, weight loss, low carb diet.

## 2023-04-22 NOTE — Assessment & Plan Note (Signed)
Stable, chronic.  Continue current medication.   amlodipine 10 mg p.o. daily, metoprolol XL 50 mg daily

## 2023-05-11 ENCOUNTER — Other Ambulatory Visit: Payer: Self-pay | Admitting: Family Medicine

## 2023-05-12 NOTE — Telephone Encounter (Signed)
Last office visit 04/22/23 for CPE.  Last refilled 11/14/2022 for 15 g with no refills.  Next Appt: CPE 04/26/24.

## 2023-05-26 ENCOUNTER — Other Ambulatory Visit: Payer: Self-pay | Admitting: Family Medicine

## 2023-05-26 ENCOUNTER — Encounter: Payer: Self-pay | Admitting: Pharmacist

## 2023-05-26 DIAGNOSIS — E7800 Pure hypercholesterolemia, unspecified: Secondary | ICD-10-CM

## 2023-05-26 MED ORDER — ATORVASTATIN CALCIUM 20 MG PO TABS
20.0000 mg | ORAL_TABLET | Freq: Every day | ORAL | 3 refills | Status: DC
Start: 2023-05-26 — End: 2023-06-09

## 2023-05-26 NOTE — Progress Notes (Signed)
Pharmacy Quality Measure Review  This patient is appearing on a report for being at risk of failing the adherence measure for cholesterol (statin) medications this calendar year.   Medication: atorvastatin 20 mg Last fill date: 02/09/23 for 90 day supply  Rx expired October 2024. No refills remaining. Msg PCP for medication refill.   Will collaborate with provider to facilitate refill needs.

## 2023-05-26 NOTE — Progress Notes (Signed)
Atorvastatin refill sent to patient's pharmacy.

## 2023-05-28 ENCOUNTER — Other Ambulatory Visit: Payer: Self-pay

## 2023-05-28 ENCOUNTER — Other Ambulatory Visit: Payer: Self-pay | Admitting: Family Medicine

## 2023-05-28 ENCOUNTER — Other Ambulatory Visit (HOSPITAL_COMMUNITY): Payer: Self-pay

## 2023-05-28 MED ORDER — METOPROLOL SUCCINATE ER 50 MG PO TB24
50.0000 mg | ORAL_TABLET | Freq: Every day | ORAL | 3 refills | Status: DC
  Filled 2023-05-28: qty 90, 90d supply, fill #0
  Filled 2023-07-06 – 2023-08-27 (×2): qty 90, 90d supply, fill #1
  Filled 2023-11-26: qty 90, 90d supply, fill #2
  Filled 2024-02-29: qty 90, 90d supply, fill #3

## 2023-05-28 MED ORDER — AMLODIPINE BESYLATE 10 MG PO TABS
10.0000 mg | ORAL_TABLET | Freq: Every day | ORAL | 3 refills | Status: DC
  Filled 2023-05-28: qty 90, 90d supply, fill #0
  Filled 2023-07-06 – 2023-08-27 (×2): qty 90, 90d supply, fill #1
  Filled 2023-11-26: qty 90, 90d supply, fill #2
  Filled 2024-02-29: qty 90, 90d supply, fill #3

## 2023-06-04 ENCOUNTER — Other Ambulatory Visit: Payer: Self-pay | Admitting: Family Medicine

## 2023-06-05 NOTE — Telephone Encounter (Signed)
Last office visit 04/22/2023 for CPE.  Last refilled 05/12/2023 for 15 g with no refills.  Next Appt: CPE 04/26/24.

## 2023-06-09 ENCOUNTER — Other Ambulatory Visit: Payer: Self-pay | Admitting: Family Medicine

## 2023-06-09 ENCOUNTER — Other Ambulatory Visit (HOSPITAL_COMMUNITY): Payer: Self-pay

## 2023-06-09 ENCOUNTER — Other Ambulatory Visit: Payer: Self-pay

## 2023-06-09 MED ORDER — ATORVASTATIN CALCIUM 20 MG PO TABS: 20.0000 mg | ORAL_TABLET | Freq: Every day | ORAL | 0 refills | Status: DC

## 2023-06-09 MED ORDER — ATORVASTATIN CALCIUM 20 MG PO TABS
20.0000 mg | ORAL_TABLET | Freq: Every day | ORAL | 2 refills | Status: DC
  Filled 2023-06-09: qty 90, 90d supply, fill #0

## 2023-06-09 NOTE — Telephone Encounter (Signed)
Copied from CRM [id number]. Topic: Clinical - Medication Refill >> Jun 09, 2023  9:09 AM Irine Seal wrote: Most Recent Primary Care Visit:  Provider: Kerby Nora E  Department: [facility]  Visit Type: PHYSICAL  Date: 04/22/2023  Medication: atorvastatin (LIPITOR) 20 MG tablet  Has the patient contacted their pharmacy? Yes (Agent: If no, request that the patient contact the pharmacy for the refill. If patient does not wish to contact the pharmacy document the reason why and proceed with request.) (Agent: If yes, when and what did the pharmacy advise?)  Is this the correct pharmacy for this prescription? Yes If no, delete pharmacy and type the correct one.  This is the patient's preferred pharmacy:    [phone] [place] - [address] [place] [address] Phone: [phone] Fax: [phone]   Has the prescription been filled recently? Yes  Is the patient out of the medication? Yes  Has the patient been seen for an appointment in the last year OR does the patient have an upcoming appointment? Yes  Can we respond through MyChart? Yes  Agent: Please be advised that Rx refills may take up to 3 business days. We ask that you follow-up with your pharmacy.

## 2023-06-09 NOTE — Telephone Encounter (Signed)
Spoke with Mrs. Holzheimer to verify pharmacy.  Lipitor was last refilled back in September to [facility] order.  [facility]  Patient states she is no longer using [facility].  [facility] can't get her medication to her until Thursday so she wants 90 supply refill sent to [facility] but would like a [facility] [place] so she can pick up today.  Refills sent as requested.

## 2023-07-01 ENCOUNTER — Other Ambulatory Visit: Payer: Self-pay | Admitting: Family Medicine

## 2023-07-06 ENCOUNTER — Other Ambulatory Visit (HOSPITAL_COMMUNITY): Payer: Self-pay

## 2023-07-06 ENCOUNTER — Other Ambulatory Visit: Payer: Self-pay

## 2023-07-06 ENCOUNTER — Other Ambulatory Visit: Payer: Self-pay | Admitting: Family Medicine

## 2023-07-14 ENCOUNTER — Other Ambulatory Visit (HOSPITAL_COMMUNITY): Payer: Self-pay

## 2023-07-14 MED ORDER — CLENPIQ 10-3.5-12 MG-GM -GM/175ML PO SOLN
175.0000 mL | Freq: Two times a day (BID) | ORAL | 0 refills | Status: DC
  Filled 2023-07-14: qty 350, 1d supply, fill #0

## 2023-07-20 ENCOUNTER — Other Ambulatory Visit: Payer: Self-pay | Admitting: Family Medicine

## 2023-07-20 DIAGNOSIS — Z1231 Encounter for screening mammogram for malignant neoplasm of breast: Secondary | ICD-10-CM

## 2023-07-28 DIAGNOSIS — Z860100 Personal history of colon polyps, unspecified: Secondary | ICD-10-CM | POA: Diagnosis not present

## 2023-07-28 DIAGNOSIS — K5730 Diverticulosis of large intestine without perforation or abscess without bleeding: Secondary | ICD-10-CM | POA: Diagnosis not present

## 2023-07-28 DIAGNOSIS — D123 Benign neoplasm of transverse colon: Secondary | ICD-10-CM | POA: Diagnosis not present

## 2023-07-28 DIAGNOSIS — Z860101 Personal history of adenomatous and serrated colon polyps: Secondary | ICD-10-CM | POA: Diagnosis not present

## 2023-07-28 DIAGNOSIS — Z1211 Encounter for screening for malignant neoplasm of colon: Secondary | ICD-10-CM | POA: Diagnosis not present

## 2023-07-28 DIAGNOSIS — K635 Polyp of colon: Secondary | ICD-10-CM | POA: Diagnosis not present

## 2023-07-28 LAB — HM COLONOSCOPY

## 2023-08-24 ENCOUNTER — Ambulatory Visit
Admission: RE | Admit: 2023-08-24 | Discharge: 2023-08-24 | Disposition: A | Discharge status: Home / Self Care | Payer: PPO | Source: Ambulatory Visit | Attending: Family Medicine | Admitting: Family Medicine

## 2023-08-24 DIAGNOSIS — Z1231 Encounter for screening mammogram for malignant neoplasm of breast: Secondary | ICD-10-CM | POA: Diagnosis not present

## 2023-08-25 ENCOUNTER — Other Ambulatory Visit (HOSPITAL_COMMUNITY): Payer: Self-pay

## 2023-08-27 ENCOUNTER — Other Ambulatory Visit (HOSPITAL_COMMUNITY): Payer: Self-pay

## 2023-10-07 ENCOUNTER — Other Ambulatory Visit (HOSPITAL_COMMUNITY): Payer: Self-pay

## 2023-11-26 ENCOUNTER — Other Ambulatory Visit: Payer: Self-pay | Admitting: Family Medicine

## 2023-11-26 ENCOUNTER — Other Ambulatory Visit (HOSPITAL_COMMUNITY): Payer: Self-pay

## 2023-11-26 MED ORDER — ESTRADIOL 0.5 MG PO TABS
0.5000 mg | ORAL_TABLET | ORAL | 1 refills | Status: DC
  Filled 2023-11-26: qty 25, 100d supply, fill #0
  Filled 2024-04-02: qty 25, 100d supply, fill #1

## 2023-11-27 ENCOUNTER — Other Ambulatory Visit: Payer: Self-pay

## 2023-12-02 ENCOUNTER — Telehealth: Payer: Self-pay

## 2023-12-02 NOTE — Telephone Encounter (Signed)
 Called patient started having symptoms last Friday. She started on Azo and has noticed some improvement but is still having urgency and low back pain. Denies any fever nausea or vomiting. Denies any conative changes. Reviewed all red words with her if any she will be seen at urgent care or ED. I have scheduled for office visit tomorrow with Dr. Joelle Musca.

## 2023-12-02 NOTE — Telephone Encounter (Signed)
 Noted

## 2023-12-02 NOTE — Telephone Encounter (Signed)
 Copied from CRM [id number]. Topic: Appointments - Appointment Scheduling >> Dec 02, 2023  8:02 AM Antonieta Kitten wrote: Patient is calling to schedule an appointment, nothing is available. Would like to be seen for possible UTI.

## 2023-12-03 ENCOUNTER — Encounter: Payer: Self-pay | Admitting: Internal Medicine

## 2023-12-03 ENCOUNTER — Ambulatory Visit (INDEPENDENT_AMBULATORY_CARE_PROVIDER_SITE_OTHER): Admitting: Internal Medicine

## 2023-12-03 VITALS — BP 130/70 | HR 68 | Temp 99.5°F | Ht 67.0 in | Wt 221.0 lb

## 2023-12-03 DIAGNOSIS — N3000 Acute cystitis without hematuria: Secondary | ICD-10-CM | POA: Diagnosis not present

## 2023-12-03 DIAGNOSIS — R350 Frequency of micturition: Secondary | ICD-10-CM | POA: Diagnosis not present

## 2023-12-03 LAB — POC URINALSYSI DIPSTICK (AUTOMATED)
Glucose, UA: NEGATIVE
Nitrite, UA: NEGATIVE
Protein, UA: POSITIVE — AB
Spec Grav, UA: 1.02 (ref 1.010–1.025)
Urobilinogen, UA: 0.2 U/dL
pH, UA: 6 (ref 5.0–8.0)

## 2023-12-03 MED ORDER — CEPHALEXIN 500 MG PO CAPS: 500.0000 mg | ORAL_CAPSULE | Freq: Three times a day (TID) | ORAL | 0 refills | Status: DC

## 2023-12-03 NOTE — Progress Notes (Signed)
 Subjective:    Patient ID: Claire Lewis, female    DOB: 1946-03-14, 78 y.o.   MRN: [id number]  HPI Here due to urinary symptoms  Started with painful urination--5 days ago Then urgency and increased frequency Tried azo a few days ago---may have helped pain some Drinking lots of water Some back pain Slight fever----but not over 100  Current Outpatient Medications on File Prior to Visit  Medication Sig Dispense Refill   amLODipine  (NORVASC ) 10 MG tablet Take 1 tablet (10 mg total) by mouth daily. 90 tablet 3   atorvastatin  (LIPITOR) 20 MG tablet TAKE 1 TABLET BY MOUTH EVERY DAY 90 tablet 2   Calcium  Carbonate-Vitamin D 600-400 MG-UNIT tablet Take 1 tablet by mouth daily.     estradiol  (ESTRACE ) 0.5 MG tablet Take 1 tablet (0.5 mg total) by mouth every 4 days. 32 tablet 1   halobetasol  (ULTRAVATE ) 0.05 % ointment APPLY TO AFFECTED AREA TWICE A DAY 15 g 0   latanoprost (XALATAN) 0.005 % ophthalmic solution 1 drop at bedtime.     metoprolol  succinate (TOPROL -XL) 50 MG 24 hr tablet Take 1 tablet (50 mg total) by mouth daily. 90 tablet 3   Omega-3 Fatty Acids (FISH OIL) 1200 MG CAPS Take 1 tablet by mouth daily.     omeprazole  (PRILOSEC) 40 MG capsule Take 1 capsule (40 mg total) by mouth daily. 90 capsule 4   Sod Picosulfate-Mag Ox-Cit Acd (CLENPIQ ) 10-3.5-12 MG-GM -GM/175ML SOLN Take 175 mLs by mouth 2 (two) times daily. 350 mL 0   No current facility-administered medications on file prior to visit.    No Known Allergies  Past Medical History:  Diagnosis Date   Allergy    GERD (gastroesophageal reflux disease)    Hypertension     Past Surgical History:  Procedure Laterality Date   CATARACT EXTRACTION, BILATERAL  04/2019   PARTIAL HYSTERECTOMY  1983   one ovary remains for endometriosis    Family History  Problem Relation Age of Onset   Heart attack Father 81   Coronary artery disease Father    Cancer Mother        breast   Dementia Mother    Breast cancer Mother     Cancer Brother        colon   Cancer Paternal Grandmother        lung   Breast cancer Paternal Grandmother    Meniere's disease Daughter     Social History   Socioeconomic History   Marital status: Married    Spouse name: Not on file   Number of children: Not on file   Years of education: Not on file   Highest education level: Not on file  Occupational History   Occupation: retired from Theatre manager: UNEMPLOYED  Tobacco Use   Smoking status: Never   Smokeless tobacco: Never  Vaping Use   Vaping status: Never Used  Substance and Sexual Activity   Alcohol use: No   Drug use: No   Sexual activity: Never  Other Topics Concern   Not on file  Social History Narrative   Regular exercise---walks 3-4 times a week   Diet: fruits and veggies, no fried foods, on weight watchers                  Social Drivers of Health   Financial Resource Strain: Low Risk  (12/01/2022)   Overall Financial Resource Strain (CARDIA)    Difficulty of Paying Living Expenses: Not  hard at all  Food Insecurity: No Food Insecurity (12/01/2022)   Hunger Vital Sign    Worried About Running Out of Food in the Last Year: Never true    Ran Out of Food in the Last Year: Never true  Transportation Needs: No Transportation Needs (03/02/2021)   PRAPARE - Administrator, Civil Service (Medical): No    Lack of Transportation (Non-Medical): No  Physical Activity: Insufficiently Active (12/01/2022)   Exercise Vital Sign    Days of Exercise per Week: 3 days    Minutes of Exercise per Session: 20 min  Stress: No Stress Concern Present (12/01/2022)   [employer] of Occupational Health - Occupational Stress Questionnaire    Feeling of Stress : Not at all  Social Connections: Socially Integrated (12/01/2022)   Social Connection and Isolation Panel    Frequency of Communication with Friends and Family: More than three times a week    Frequency of Social Gatherings with Friends and  Family: More than three times a week    Attends Religious Services: More than 4 times per year    Active Member of [employer] or Organizations: Yes    Attends Engineer, structural: More than 4 times per year    Marital Status: Married  Catering manager Violence: Not At Risk (12/01/2022)   Humiliation, Afraid, Rape, and Kick questionnaire    Fear of Current or Ex-Partner: No    Emotionally Abused: No    Physically Abused: No    Sexually Abused: No   Review of Systems No N/V Eating okay     Objective:   Physical Exam Constitutional:      Appearance: Normal appearance.  Abdominal:     Palpations: Abdomen is soft.     Tenderness: There is no abdominal tenderness. There is no right CVA tenderness or left CVA tenderness.   Neurological:     Mental Status: She is alert.            Assessment & Plan:

## 2023-12-03 NOTE — Assessment & Plan Note (Signed)
 Fairly typical symptoms but going on for 5 days No sig systemic symptoms Urinalysis 3+ leuks and blood  Will treat empirically with cephalexin  500mg  tid (Rx for a week but can stop after 3 days if rapid resolution of symptoms) Send culture

## 2023-12-06 LAB — URINE CULTURE
MICRO NUMBER:: 16601361
SPECIMEN QUALITY:: ADEQUATE

## 2023-12-07 ENCOUNTER — Ambulatory Visit: Payer: Self-pay | Admitting: Internal Medicine

## 2023-12-31 DIAGNOSIS — H40013 Open angle with borderline findings, low risk, bilateral: Secondary | ICD-10-CM | POA: Diagnosis not present

## 2024-01-05 ENCOUNTER — Other Ambulatory Visit (HOSPITAL_COMMUNITY): Payer: Self-pay

## 2024-01-07 ENCOUNTER — Other Ambulatory Visit (HOSPITAL_COMMUNITY): Payer: Self-pay

## 2024-01-07 ENCOUNTER — Other Ambulatory Visit: Payer: Self-pay

## 2024-01-07 MED ORDER — OMEPRAZOLE 40 MG PO CPDR
40.0000 mg | DELAYED_RELEASE_CAPSULE | Freq: Every day | ORAL | 4 refills | Status: AC
  Filled 2024-01-07: qty 90, 90d supply, fill #0
  Filled 2024-04-02: qty 90, 90d supply, fill #1
  Filled 2024-06-15: qty 90, 90d supply, fill #2

## 2024-02-29 ENCOUNTER — Other Ambulatory Visit (HOSPITAL_COMMUNITY): Payer: Self-pay

## 2024-03-25 ENCOUNTER — Other Ambulatory Visit: Payer: Self-pay | Admitting: Family Medicine

## 2024-04-02 ENCOUNTER — Other Ambulatory Visit (HOSPITAL_COMMUNITY): Payer: Self-pay

## 2024-04-11 ENCOUNTER — Telehealth: Payer: Self-pay | Admitting: *Deleted

## 2024-04-11 DIAGNOSIS — E7800 Pure hypercholesterolemia, unspecified: Secondary | ICD-10-CM

## 2024-04-11 DIAGNOSIS — R7303 Prediabetes: Secondary | ICD-10-CM

## 2024-04-11 NOTE — Telephone Encounter (Signed)
-----   Message from [name] Claire Lewis sent at 04/11/2024  2:57 PM EDT ----- Regarding: Lab orders forTue, 11.4.25 Patient is scheduled for CPX labs, please order future labs, Thanks , [name]

## 2024-04-12 ENCOUNTER — Ambulatory Visit (INDEPENDENT_AMBULATORY_CARE_PROVIDER_SITE_OTHER): Payer: PPO

## 2024-04-12 VITALS — Ht 67.0 in | Wt 221.0 lb

## 2024-04-12 DIAGNOSIS — Z0000 Encounter for general adult medical examination without abnormal findings: Secondary | ICD-10-CM

## 2024-04-12 NOTE — Patient Instructions (Signed)
 [name],  Thank you for taking the time for your Medicare Wellness Visit. I appreciate your continued commitment to your health goals. Please review the care plan we discussed, and feel free to reach out if I can assist you further.  Medicare recommends these wellness visits once per year to help you and your care team stay ahead of potential health issues. These visits are designed to focus on prevention, allowing your provider to concentrate on managing your acute and chronic conditions during your regular appointments.  Please note that Annual Wellness Visits do not include a physical exam. Some assessments may be limited, especially if the visit was conducted virtually. If needed, we may recommend a separate in-person follow-up with your provider.  Ongoing Care Seeing your primary care provider every 3 to 6 months helps us  monitor your health and provide consistent, personalized care.   Referrals If a referral was made during today's visit and you haven't received any updates within two weeks, please contact the referred provider directly to check on the status.  Recommended Screenings:  Health Maintenance  Topic Date Due   Zoster (Shingles) Vaccine (1 of 2) 08/22/1995   DTaP/Tdap/Td vaccine (2 - Tdap) 06/20/2019   Flu Shot  01/15/2024   COVID-19 Vaccine (6 - 2025-26 season) 02/15/2024   Breast Cancer Screening  08/23/2024   Medicare Annual Wellness Visit  04/12/2025   DEXA scan (bone density measurement)  08/21/2027   Colon Cancer Screening  07/27/2028   Pneumococcal Vaccine for age over 48  Completed   Hepatitis C Screening  Completed   Meningitis B Vaccine  Aged Out       12/01/2022    9:25 AM  Advanced Directives  Does Patient Have a Medical Advance Directive? Yes  Type of Estate Agent of [place];Living will  Does patient want to make changes to medical advance directive? No - Patient declined  Copy of Healthcare Power of Attorney in Chart? Yes  - validated most recent copy scanned in chart (See row information)   Advance Care Planning is important because it: Ensures you receive medical care that aligns with your values, goals, and preferences. Provides guidance to your family and loved ones, reducing the emotional burden of decision-making during critical moments.  Vision: Annual vision screenings are recommended for early detection of glaucoma, cataracts, and diabetic retinopathy. These exams can also reveal signs of chronic conditions such as diabetes and high blood pressure.  Dental: Annual dental screenings help detect early signs of oral cancer, gum disease, and other conditions linked to overall health, including heart disease and diabetes.

## 2024-04-12 NOTE — Progress Notes (Signed)
 Subjective:   Claire Lewis is a 78 y.o. who presents for a Medicare Wellness preventive visit.  As a reminder, Annual Wellness Visits don't include a physical exam, and some assessments may be limited, especially if this visit is performed virtually. We may recommend an in-person follow-up visit with your provider if needed.  Visit Complete: Virtual I connected with  Claire Lewis on 04/12/24 by a audio enabled telemedicine application and verified that I am speaking with the correct person using two identifiers.  Patient Location: Home  Provider Location: Office/Clinic  I discussed the limitations of evaluation and management by telemedicine. The patient expressed understanding and agreed to proceed.  Vital Signs: Because this visit was a virtual/telehealth visit, some criteria may be missing or patient reported. Any vitals not documented were not able to be obtained and vitals that have been documented are patient reported.  VideoDeclined- This patient declined Librarian, academic. Therefore the visit was completed with audio only.  Persons Participating in Visit: Patient.  AWV Questionnaire: No: Patient Medicare AWV questionnaire was not completed prior to this visit.  Cardiac Risk Factors include: advanced age (>55men, >77 women);dyslipidemia;hypertension;obesity (BMI >30kg/m2);sedentary lifestyle     Objective:    Today's Vitals   04/12/24 0852 04/12/24 0853  Weight: 221 lb (100.2 kg)   Height: 5' 7 (1.702 m)   PainSc:  3    Body mass index is 34.61 kg/m.     04/12/2024    9:07 AM 12/01/2022    9:25 AM 02/29/2020    8:20 AM 01/26/2018    8:40 AM 01/14/2017    8:13 AM 01/09/2016    9:13 AM  Advanced Directives  Does Patient Have a Medical Advance Directive? Yes Yes No No  No  No   Type of Estate Agent of [place];Living will Healthcare Power of [place];Living will      Does patient want to make changes to  medical advance directive?  No - Patient declined      Copy of Healthcare Power of Attorney in Chart? Yes - validated most recent copy scanned in chart (See row information) Yes - validated most recent copy scanned in chart (See row information)      Would patient like information on creating a medical advance directive?   No - Patient declined No - Patient declined  No - Patient declined  No - patient declined information      Data saved with a previous flowsheet row definition    Current Medications (verified) Outpatient Encounter Medications as of 04/12/2024  Medication Sig   amLODipine  (NORVASC ) 10 MG tablet Take 1 tablet (10 mg total) by mouth daily.   atorvastatin  (LIPITOR) 20 MG tablet TAKE 1 TABLET BY MOUTH EVERY DAY   Calcium  Carbonate-Vitamin D 600-400 MG-UNIT tablet Take 1 tablet by mouth daily.   estradiol  (ESTRACE ) 0.5 MG tablet Take 1 tablet (0.5 mg total) by mouth every 4 days.   halobetasol  (ULTRAVATE ) 0.05 % ointment APPLY TO AFFECTED AREA TWICE A DAY   latanoprost (XALATAN) 0.005 % ophthalmic solution 1 drop at bedtime.   metoprolol  succinate (TOPROL -XL) 50 MG 24 hr tablet Take 1 tablet (50 mg total) by mouth daily.   Omega-3 Fatty Acids (FISH OIL) 1200 MG CAPS Take 1 tablet by mouth daily.   omeprazole  (PRILOSEC) 40 MG capsule Take 1 capsule (40 mg total) by mouth daily.   cephALEXin  (KEFLEX ) 500 MG capsule Take 1 capsule (500 mg total) by mouth 3 (three)  times daily. (Patient not taking: Reported on 04/12/2024)   Sod Picosulfate-Mag Ox-Cit Acd (CLENPIQ ) 10-3.5-12 MG-GM -GM/175ML SOLN Take 175 mLs by mouth 2 (two) times daily. (Patient not taking: Reported on 04/12/2024)   No facility-administered encounter medications on file as of 04/12/2024.    Allergies (verified) Patient has no known allergies.   History: Past Medical History:  Diagnosis Date   Allergy    Cataract 2020   Had surgery 2020   GERD (gastroesophageal reflux disease)    Hypertension    Past  Surgical History:  Procedure Laterality Date   ABDOMINAL HYSTERECTOMY  1982   Approximate   CATARACT EXTRACTION, BILATERAL  04/2019   EYE SURGERY  2020   PARTIAL HYSTERECTOMY  1983   one ovary remains for endometriosis   TUBAL LIGATION  1977   Family History  Problem Relation Age of Onset   Heart attack Father 50   Coronary artery disease Father    Cancer Mother        breast   Dementia Mother    Breast cancer Mother    Cancer Brother        colon   Cancer Paternal Grandmother        lung   Breast cancer Paternal Grandmother    Meniere's disease Daughter    Cancer Daughter    Social History   Socioeconomic History   Marital status: Married    Spouse name: Not on file   Number of children: Not on file   Years of education: Not on file   Highest education level: Not on file  Occupational History   Occupation: retired from Theatre Manager: UNEMPLOYED  Tobacco Use   Smoking status: Never   Smokeless tobacco: Never  Vaping Use   Vaping status: Never Used  Substance and Sexual Activity   Alcohol use: No   Drug use: No   Sexual activity: Never  Other Topics Concern   Not on file  Social History Narrative   Regular exercise---walks 3-4 times a week   Diet: fruits and veggies, no fried foods, on weight watchers                  Social Drivers of Health   Financial Resource Strain: Low Risk  (04/12/2024)   Overall Financial Resource Strain (CARDIA)    Difficulty of Paying Living Expenses: Not hard at all  Food Insecurity: No Food Insecurity (04/12/2024)   Hunger Vital Sign    Worried About Running Out of Food in the Last Year: Never true    Ran Out of Food in the Last Year: Never true  Transportation Needs: No Transportation Needs (04/12/2024)   PRAPARE - Administrator, Civil Service (Medical): No    Lack of Transportation (Non-Medical): No  Physical Activity: Inactive (04/12/2024)   Exercise Vital Sign    Days of Exercise per Week:  0 days    Minutes of Exercise per Session: 0 min  Stress: No Stress Concern Present (04/12/2024)   [employer] of Occupational Health - Occupational Stress Questionnaire    Feeling of Stress: Not at all  Social Connections: Socially Integrated (04/12/2024)   Social Connection and Isolation Panel    Frequency of Communication with Friends and Family: More than three times a week    Frequency of Social Gatherings with Friends and Family: More than three times a week    Attends Religious Services: More than 4 times per year    Active  Member of Clubs or Organizations: Yes    Attends Engineer, Structural: More than 4 times per year    Marital Status: Married    Tobacco Counseling Counseling given: Not Answered    Clinical Intake:  Pre-visit preparation completed: Yes  Pain : 0-10 Pain Score: 3  Pain Type: Acute pain Pain Location: Back Pain Orientation: Lower Pain Descriptors / Indicators: Aching, Spasm Pain Onset: In the past 7 days Pain Frequency: Intermittent Pain Relieving Factors: heat and IBU Effect of Pain on Daily Activities: less movement to allow to rest  Pain Relieving Factors: heat and IBU  BMI - recorded: 34.61 Nutritional Status: BMI > 30  Obese Nutritional Risks: None Diabetes: No  Lab Results  Component Value Date   HGBA1C 5.8 03/06/2023   HGBA1C 5.9 03/04/2022   HGBA1C 5.8 03/08/2021     How often do you need to have someone help you when you read instructions, pamphlets, or other written materials from your doctor or pharmacy?: 1 - Never  Interpreter Needed?: No  Comments: lives alone Information entered by :: Claire Brocks,LPN   Activities of Daily Living     04/12/2024    9:08 AM  In your present state of health, do you have any difficulty performing the following activities:  Hearing? 0  Vision? 0  Difficulty concentrating or making decisions? 0  Walking or climbing stairs? 0  Dressing or bathing? 0  Doing errands,  shopping? 0  Preparing Food and eating ? N  Using the Toilet? N  In the past six months, have you accidently leaked urine? N  Do you have problems with loss of bowel control? N  Managing your Medications? N  Managing your Finances? N  Housekeeping or managing your Housekeeping? N    Patient Care Team: Avelina Greig BRAVO, MD as PCP - General Alm Lloyd as Consulting Physician (Dentistry) Fate Morna SAILOR, [place] (Inactive) as Pharmacist (Pharmacist) Glendia Simmonds, OD as Referring Physician (Optometry)  I have updated your Care Teams any recent Medical Services you may have received from other providers in the past year.     Assessment:   This is a routine wellness examination for Claire Lewis.  Hearing/Vision screen Hearing Screening - Comments:: Patient denies any hearing difficulties.   Vision Screening - Comments:: Pt says their vision is good without glasses;readers only [facility]- Dr Simmonds Glendia visits every for glaucoma   Goals Addressed             This Visit's Progress    Cut out extra servings   On track    04/12/24-Pt wishes to stay healthy.     COMPLETED: Increase physical activity       Starting 01/26/2018, I will continue to exercise for at least 30 min 3 days a week.       Patient Stated   On track    04/12/24-Get off some medication, exercise more, drink more water and eat healthy.       Depression Screen     04/12/2024    9:04 AM 12/03/2023    9:51 AM 04/22/2023   10:28 AM 12/01/2022    9:24 AM 10/08/2022   11:33 AM 03/11/2022    9:39 AM 03/02/2021    9:00 AM  PHQ 2/9 Scores  PHQ - 2 Score 1 0 0 0 0 0 0  PHQ- 9 Score   0 0 0      Fall Risk     04/12/2024    8:59 AM  12/03/2023    9:51 AM 04/22/2023   10:28 AM 12/01/2022    9:25 AM 11/26/2022    3:35 PM  Fall Risk   Falls in the past year? 0 0 0 0 0  Number falls in past yr: 0 0 0 0 0  Injury with Fall? 0 0 0 0   Risk for fall due to : No Fall Risks No Fall Risks No Fall Risks No Fall  Risks   Follow up Falls prevention discussed;Education provided Falls evaluation completed Falls evaluation completed Falls prevention discussed;Falls evaluation completed     MEDICARE RISK AT HOME:  Medicare Risk at Home Any stairs in or around the home?: Yes If so, are there any without handrails?: Yes Home free of loose throw rugs in walkways, pet beds, electrical cords, etc?: Yes Adequate lighting in your home to reduce risk of falls?: Yes Life alert?: No Use of a cane, walker or w/c?: No Grab bars in the bathroom?: Yes Shower chair or bench in shower?: Yes Elevated toilet seat or a handicapped toilet?: No  TIMED UP AND GO:  Was the test performed?  No  Cognitive Function: 6CIT completed    02/29/2020    8:27 AM 01/26/2018    8:59 AM 01/14/2017    8:15 AM 01/09/2016    9:42 AM  MMSE - Mini Mental State Exam  Orientation to time 5 5 5  5    Orientation to Place 5 5 5  5    Registration 3 3 3  3    Attention/ Calculation 5 0 0  0   Recall 3 3 3  3    Language- name 2 objects  0 0  0   Language- repeat 1 1 1 1   Language- follow 3 step command  3 3  3    Language- read & follow direction  0 0  0   Write a sentence  0 0  0   Copy design  0 0  0   Total score  20 20  20       Data saved with a previous flowsheet row definition        04/12/2024    9:09 AM 12/01/2022    9:26 AM 03/02/2021    9:03 AM  6CIT Screen  What Year? 0 points 0 points 0 points  What month? 0 points 0 points 0 points  What time? 0 points 0 points 0 points  Count back from 20 0 points 0 points 0 points  Months in reverse 0 points 0 points 2 points  Repeat phrase 0 points 0 points 2 points  Total Score 0 points 0 points 4 points    Immunizations Immunization History  Administered Date(s) Administered   Fluad Quad(high Dose 65+) 02/08/2019, 03/01/2020, 03/08/2021, 03/11/2022   Fluad Trivalent(High Dose 65+) 04/22/2023   INFLUENZA, HIGH DOSE SEASONAL PF 06/15/2015, 04/13/2018   Influenza Whole  04/16/2010   PFIZER(Purple Top)SARS-COV-2 Vaccination 07/23/2019, 08/13/2019, 04/13/2020, 01/24/2021   Pfizer(Comirnaty)Fall Seasonal Vaccine 12 years and older 03/25/2022   Pneumococcal Conjugate-13 11/07/2014   Pneumococcal Polysaccharide-23 10/04/2013   Td 06/19/2009   Zoster, Live 05/16/2008    Screening Tests Health Maintenance  Topic Date Due   Zoster Vaccines- Shingrix (1 of 2) 08/22/1995   DTaP/Tdap/Td (2 - Tdap) 06/20/2019   Influenza Vaccine  01/15/2024   COVID-19 Vaccine (6 - 2025-26 season) 02/15/2024   Mammogram  08/23/2024   Medicare Annual Wellness (AWV)  04/12/2025   DEXA SCAN  08/21/2027   Colonoscopy  07/27/2028   Pneumococcal Vaccine: 50+ Years  Completed   Hepatitis C Screening  Completed   Meningococcal B Vaccine  Aged Out    Health Maintenance Items Addressed: Pt says she will get   Additional Screening:  Vision Screening: Recommended annual ophthalmology exams for early detection of glaucoma and other disorders of the eye. Is the patient up to date with their annual eye exam?  Yes  Who is the provider or what is the name of the office in which the patient attends annual eye exams? Dr Thom Hamilton  Dental Screening: Recommended annual dental exams for proper oral hygiene  Community Resource Referral / Chronic Care Management: CRR required this visit?  No   CCM required this visit?  No   Plan:    I have personally reviewed and noted the following in the patient's chart:   Medical and social history Use of alcohol, tobacco or illicit drugs  Current medications and supplements including opioid prescriptions. Patient is not currently taking opioid prescriptions. Functional ability and status Nutritional status Physical activity Advanced directives List of other physicians Hospitalizations, surgeries, and ER visits in previous 12 months Vitals Screenings to include cognitive, depression, and falls Referrals and appointments  In addition, I  have reviewed and discussed with patient certain preventive protocols, quality metrics, and best practice recommendations. A written personalized care plan for preventive services as well as general preventive health recommendations were provided to patient.   Claire LITTIE Saris, LPN   [id number]   After Visit Summary: (MyChart) Due to this being a telephonic visit, the after visit summary with patients personalized plan was offered to patient via MyChart   Notes: Nothing significant to report at this time.

## 2024-04-15 ENCOUNTER — Other Ambulatory Visit: Payer: Self-pay | Admitting: Family Medicine

## 2024-04-19 ENCOUNTER — Ambulatory Visit: Payer: Self-pay | Admitting: Family Medicine

## 2024-04-19 ENCOUNTER — Other Ambulatory Visit (INDEPENDENT_AMBULATORY_CARE_PROVIDER_SITE_OTHER): Payer: PPO

## 2024-04-19 DIAGNOSIS — R7303 Prediabetes: Secondary | ICD-10-CM

## 2024-04-19 DIAGNOSIS — E7800 Pure hypercholesterolemia, unspecified: Secondary | ICD-10-CM

## 2024-04-19 LAB — COMPREHENSIVE METABOLIC PANEL WITH GFR
ALT: 14 U/L (ref 0–35)
AST: 17 U/L (ref 0–37)
Albumin: 4 g/dL (ref 3.5–5.2)
Alkaline Phosphatase: 94 U/L (ref 39–117)
BUN: 18 mg/dL (ref 6–23)
CO2: 28 meq/L (ref 19–32)
Calcium: 9.2 mg/dL (ref 8.4–10.5)
Chloride: 104 meq/L (ref 96–112)
Creatinine, Ser: 0.84 mg/dL (ref 0.40–1.20)
GFR: 66.45 mL/min (ref >60.00)
Glucose, Bld: 111 mg/dL — ABNORMAL HIGH (ref 70–99)
Potassium: 4.1 meq/L (ref 3.5–5.1)
Sodium: 140 meq/L (ref 135–145)
Total Bilirubin: 0.5 mg/dL (ref 0.2–1.2)
Total Protein: 7 g/dL (ref 6.0–8.3)

## 2024-04-19 LAB — LIPID PANEL
Cholesterol: 162 mg/dL (ref 0–200)
HDL: 50 mg/dL (ref >39.00)
LDL Cholesterol: 84 mg/dL (ref 0–99)
NonHDL: 112.31
Total CHOL/HDL Ratio: 3
Triglycerides: 140 mg/dL (ref 0.0–149.0)
VLDL: 28 mg/dL (ref 0.0–40.0)

## 2024-04-19 LAB — HEMOGLOBIN A1C: Hgb A1c MFr Bld: 6.1 % (ref 4.6–6.5)

## 2024-04-19 NOTE — Progress Notes (Signed)
 No critical labs need to be addressed urgently. We will discuss labs in detail at upcoming office visit.

## 2024-04-26 ENCOUNTER — Other Ambulatory Visit (HOSPITAL_COMMUNITY): Payer: Self-pay

## 2024-04-26 ENCOUNTER — Encounter: Payer: Self-pay | Admitting: Family Medicine

## 2024-04-26 ENCOUNTER — Ambulatory Visit: Payer: PPO | Admitting: Family Medicine

## 2024-04-26 VITALS — BP 130/70 | HR 73 | Temp 98.2°F | Ht 64.5 in | Wt 219.1 lb

## 2024-04-26 DIAGNOSIS — Z0000 Encounter for general adult medical examination without abnormal findings: Secondary | ICD-10-CM

## 2024-04-26 DIAGNOSIS — I10 Essential (primary) hypertension: Secondary | ICD-10-CM

## 2024-04-26 DIAGNOSIS — Z23 Encounter for immunization: Secondary | ICD-10-CM

## 2024-04-26 DIAGNOSIS — R7303 Prediabetes: Secondary | ICD-10-CM

## 2024-04-26 DIAGNOSIS — E7800 Pure hypercholesterolemia, unspecified: Secondary | ICD-10-CM | POA: Diagnosis not present

## 2024-04-26 MED ORDER — AMLODIPINE BESYLATE 10 MG PO TABS
10.0000 mg | ORAL_TABLET | Freq: Every day | ORAL | 3 refills | Status: AC
  Filled 2024-04-26 – 2024-05-25 (×2): qty 90, 90d supply, fill #0

## 2024-04-26 MED ORDER — ATORVASTATIN CALCIUM 20 MG PO TABS
20.0000 mg | ORAL_TABLET | Freq: Every day | ORAL | 3 refills | Status: AC
  Filled 2024-04-26 – 2024-06-15 (×3): qty 90, 90d supply, fill #0

## 2024-04-26 MED ORDER — METOPROLOL SUCCINATE ER 50 MG PO TB24
50.0000 mg | ORAL_TABLET | Freq: Every day | ORAL | 3 refills | Status: AC
  Filled 2024-04-26 – 2024-05-25 (×2): qty 90, 90d supply, fill #0

## 2024-04-26 NOTE — Assessment & Plan Note (Signed)
 Encouraged exercise, weight loss, low carb diet.

## 2024-04-26 NOTE — Progress Notes (Signed)
 Patient ID: Claire Lewis, female    DOB: 07-21-1945, 78 y.o.   MRN: [id number]  This visit was conducted in person.  BP 130/70   Pulse 73   Temp 98.2 F (36.8 C) (Temporal)   Ht 5' 4.5 (1.638 m)   Wt 219 lb 2 oz (99.4 kg)   SpO2 98%   BMI 37.03 kg/m    CC:  Chief Complaint  Patient presents with   Annual Exam    Part 2 MWV 04/12/24     Subjective:   HPI: Claire Lewis is a 78 y.o. female presenting on 04/26/2024 for Annual Exam (Part 2/MWV 04/12/24/) The patient presents for complete physical and review of chronic health problems. He/She also has the following acute concerns today:  Husband passed away in last year.  The patient saw a LPN or RN for medicare wellness visit. 04/12/2024  Prevention and wellness was reviewed in detail. Note reviewed and important notes copied below.  Flowsheet Row Clinical Support from 04/12/2024 in [facility]  PHQ-2 Total Score 1   Hypertension:  Blood pressure at goal on amlodipine  10 mg p.o. daily, metoprolol  XL 50 mg daily BP Readings from Last 3 Encounters:  04/26/24 130/70  12/03/23 130/70  04/22/23 122/62  Using medication without problems or lightheadedness:  none Chest pain with exertion: none Edema:none Short of breath: none Average home BPs: Other issues:    Renal function significantly improved.  prediabetes  Lab Results  Component Value Date   HGBA1C 6.1 04/19/2024    Elevated Cholesterol: LDL at goal on atorvastatin  20 mg p.o. daily Lab Results  Component Value Date   CHOL 162 04/19/2024   HDL 50.00 04/19/2024   LDLCALC 84 04/19/2024   TRIG 140.0 04/19/2024   CHOLHDL 3 04/19/2024  Using medications without problems: none Muscle aches:  none Diet compliance: moderate, Exercise:  minimal in plast year Other complaints:       Relevant past medical, surgical, family and social history reviewed and updated as indicated. Interim medical history since our last  visit reviewed. Allergies and medications reviewed and updated. Outpatient Medications Prior to Visit  Medication Sig Dispense Refill   Calcium  Carbonate-Vitamin D 600-400 MG-UNIT tablet Take 1 tablet by mouth daily.     estradiol  (ESTRACE ) 0.5 MG tablet Take 0.5 mg by mouth once a week.     halobetasol  (ULTRAVATE ) 0.05 % ointment APPLY TO AFFECTED AREA TWICE A DAY 15 g 0   latanoprost (XALATAN) 0.005 % ophthalmic solution 1 drop at bedtime.     Omega-3 Fatty Acids (FISH OIL) 1200 MG CAPS Take 1 tablet by mouth daily.     omeprazole  (PRILOSEC) 40 MG capsule Take 1 capsule (40 mg total) by mouth daily. 90 capsule 4   amLODipine  (NORVASC ) 10 MG tablet Take 1 tablet (10 mg total) by mouth daily. 90 tablet 3   atorvastatin  (LIPITOR) 20 MG tablet TAKE 1 TABLET BY MOUTH EVERY DAY 90 tablet 0   metoprolol  succinate (TOPROL -XL) 50 MG 24 hr tablet Take 1 tablet (50 mg total) by mouth daily. 90 tablet 3   cephALEXin  (KEFLEX ) 500 MG capsule Take 1 capsule (500 mg total) by mouth 3 (three) times daily. (Patient not taking: Reported on 04/12/2024) 21 capsule 0   estradiol  (ESTRACE ) 0.5 MG tablet Take 1 tablet (0.5 mg total) by mouth every 4 days. 32 tablet 1   Sod Picosulfate-Mag Ox-Cit Acd (CLENPIQ ) 10-3.5-12 MG-GM -GM/175ML SOLN Take 175 mLs by  mouth 2 (two) times daily. (Patient not taking: Reported on 04/12/2024) 350 mL 0   No facility-administered medications prior to visit.     Per HPI unless specifically indicated in ROS section below Review of Systems  Constitutional:  Negative for fatigue and fever.  HENT:  Negative for congestion.   Eyes:  Negative for pain.  Respiratory:  Negative for cough and shortness of breath.   Cardiovascular:  Negative for chest pain, palpitations and leg swelling.  Gastrointestinal:  Negative for abdominal pain.  Genitourinary:  Negative for dysuria and vaginal bleeding.  Musculoskeletal:  Negative for back pain.  Neurological:  Negative for syncope,  light-headedness and headaches.  Psychiatric/Behavioral:  Negative for dysphoric mood.    Objective:  BP 130/70   Pulse 73   Temp 98.2 F (36.8 C) (Temporal)   Ht 5' 4.5 (1.638 m)   Wt 219 lb 2 oz (99.4 kg)   SpO2 98%   BMI 37.03 kg/m   Wt Readings from Last 3 Encounters:  04/26/24 219 lb 2 oz (99.4 kg)  04/12/24 221 lb (100.2 kg)  12/03/23 221 lb (100.2 kg)      Physical Exam Vitals and nursing note reviewed.  Constitutional:      General: She is not in acute distress.    Appearance: Normal appearance. She is well-developed. She is obese. She is not ill-appearing or toxic-appearing.  HENT:     Head: Normocephalic.     Right Ear: Hearing, tympanic membrane, ear canal and external ear normal.     Left Ear: Hearing, tympanic membrane, ear canal and external ear normal.     Nose: Nose normal.  Eyes:     General: Lids are normal. Lids are everted, no foreign bodies appreciated.     Conjunctiva/sclera: Conjunctivae normal.     Pupils: Pupils are equal, round, and reactive to light.  Neck:     Thyroid: No thyroid mass or thyromegaly.     Vascular: No carotid bruit.     Trachea: Trachea normal.  Cardiovascular:     Rate and Rhythm: Normal rate and regular rhythm.     Heart sounds: Normal heart sounds, S1 normal and S2 normal. No murmur heard.    No gallop.  Pulmonary:     Effort: Pulmonary effort is normal. No respiratory distress.     Breath sounds: Normal breath sounds. No wheezing, rhonchi or rales.  Abdominal:     General: Bowel sounds are normal. There is no distension or abdominal bruit.     Palpations: Abdomen is soft. There is no fluid wave or mass.     Tenderness: There is no abdominal tenderness. There is no guarding or rebound.     Hernia: No hernia is present.  Musculoskeletal:     Cervical back: Normal range of motion and neck supple.  Lymphadenopathy:     Cervical: No cervical adenopathy.  Skin:    General: Skin is warm and dry.     Findings: No rash.   Neurological:     Mental Status: She is alert.     Cranial Nerves: No cranial nerve deficit.     Sensory: No sensory deficit.  Psychiatric:        Mood and Affect: Mood is not anxious or depressed.        Speech: Speech normal.        Behavior: Behavior normal. Behavior is cooperative.        Judgment: Judgment normal.       Results for  orders placed or performed in visit on 04/19/24  Lipid panel   Collection Time: 04/19/24  7:54 AM  Result Value Ref Range   Cholesterol 162 0 - 200 mg/dL   Triglycerides 859.9 0.0 - 149.0 mg/dL   HDL 49.99 >60.99 mg/dL   VLDL 71.9 0.0 - 59.9 mg/dL   LDL Cholesterol 84 0 - 99 mg/dL   Total CHOL/HDL Ratio 3    NonHDL 112.31   Hemoglobin A1c   Collection Time: 04/19/24  7:54 AM  Result Value Ref Range   Hgb A1c MFr Bld 6.1 4.6 - 6.5 %  Comprehensive metabolic panel   Collection Time: 04/19/24  7:54 AM  Result Value Ref Range   Sodium 140 135 - 145 mEq/L   Potassium 4.1 3.5 - 5.1 mEq/L   Chloride 104 96 - 112 mEq/L   CO2 28 19 - 32 mEq/L   Glucose, Bld 111 (H) 70 - 99 mg/dL   BUN 18 6 - 23 mg/dL   Creatinine, Ser 9.15 0.40 - 1.20 mg/dL   Total Bilirubin 0.5 0.2 - 1.2 mg/dL   Alkaline Phosphatase 94 39 - 117 U/L   AST 17 0 - 37 U/L   ALT 14 0 - 35 U/L   Total Protein 7.0 6.0 - 8.3 g/dL   Albumin 4.0 3.5 - 5.2 g/dL   GFR 33.54 >39.99 mL/min   Calcium  9.2 8.4 - 10.5 mg/dL     COVID 19 screen:  No recent travel or known exposure to COVID19 The patient denies respiratory symptoms of COVID 19 at this time. The importance of social distancing was discussed today.   Assessment and Plan   The patient's preventative maintenance and recommended screening tests for an annual wellness exam were reviewed in full today. Brought up to date unless services declined.  Counselled on the importance of diet, exercise, and its role in overall health and mortality. The patient's FH and SH was reviewed, including their home life, tobacco status, and  drug and alcohol status.   Vaccines: Uptodate with PNA,consider shingles, given high dose flu today. Refused td. S/P COVID19 vaccine series x 5. DEXA: Stable, normal 03/2016, On HRT.   Repeat 08/2021: osteopenia, repeat in 2-5 years. On Ca vit d. Mammogram: 08/2023 nml,  MGM, mother with breast cancer. DVE/PAP : TAH but has one ovary...  No symptoms, no family histroy of ovarian    cancer.    Does have lichen sclerosis... using cream prn. No need for DVE, pt agreeable Colon: 2019, 07/28/2023 polyps, family history colon cancer brother, repeat in 5 years, Dr. Rollin.  Nonsmoker.  Hep c: done  Problem List Items Addressed This Visit     Essential hypertension, benign (Chronic)   Stable, chronic.  Continue current medication.   amlodipine  10 mg p.o. daily, metoprolol  XL 50 mg daily      Relevant Medications   amLODipine  (NORVASC ) 10 MG tablet   atorvastatin  (LIPITOR) 20 MG tablet   metoprolol  succinate (TOPROL -XL) 50 MG 24 hr tablet   Prediabetes (Chronic)   Encouraged exercise, weight loss, low carb diet.       Pure hypercholesterolemia (Chronic)   Stable, chronic.  Continue current medication.   Atorvastatin  20 mg daily      Relevant Medications   amLODipine  (NORVASC ) 10 MG tablet   atorvastatin  (LIPITOR) 20 MG tablet   metoprolol  succinate (TOPROL -XL) 50 MG 24 hr tablet   Other Visit Diagnoses       Routine general medical examination at a health  care facility    -  Primary     Need for influenza vaccination       Relevant Orders   Flu vaccine HIGH DOSE PF(Fluzone Trivalent) (Completed)           Greig Ring, MD

## 2024-04-26 NOTE — Assessment & Plan Note (Signed)
Stable, chronic.  Continue current medication. ? ? ?Atorvastatin 20 mg daily ?

## 2024-04-26 NOTE — Assessment & Plan Note (Signed)
Stable, chronic.  Continue current medication.   amlodipine 10 mg p.o. daily, metoprolol XL 50 mg daily

## 2024-05-03 ENCOUNTER — Other Ambulatory Visit (HOSPITAL_COMMUNITY): Payer: Self-pay

## 2024-05-25 ENCOUNTER — Other Ambulatory Visit (HOSPITAL_COMMUNITY): Payer: Self-pay

## 2024-05-25 ENCOUNTER — Other Ambulatory Visit: Payer: Self-pay

## 2024-06-15 ENCOUNTER — Other Ambulatory Visit: Payer: Self-pay

## 2024-06-15 ENCOUNTER — Other Ambulatory Visit (HOSPITAL_COMMUNITY): Payer: Self-pay

## 2024-07-20 ENCOUNTER — Ambulatory Visit: Payer: Self-pay

## 2024-07-20 ENCOUNTER — Ambulatory Visit
Admission: EM | Admit: 2024-07-20 | Discharge: 2024-07-20 | Disposition: A | Discharge status: Home / Self Care | Source: Home / Self Care

## 2024-07-20 ENCOUNTER — Encounter: Payer: Self-pay | Admitting: Emergency Medicine

## 2024-07-20 DIAGNOSIS — I10 Essential (primary) hypertension: Secondary | ICD-10-CM | POA: Diagnosis not present

## 2024-07-20 NOTE — Telephone Encounter (Signed)
 FYI Only or Action Required?: Action required by provider: clinical question for provider, update on patient condition, and ER Refusal.  Patient was last seen in primary care on 04/26/2024 by Claire Greig BRAVO, MD.  Called Nurse Triage reporting Hypertension.  Symptoms began yesterday.  Interventions attempted: Rest, hydration, or home remedies.  Symptoms are: patient states slightly better as far as the dizziness but blood pressure systolic still elevated at this time.  Triage Disposition: Go to ED Now (Notify PCP)  Patient/caregiver understands and will follow disposition?: No, wishes to speak with PCP     Message from [facility] S sent at 07/20/2024  8:54 AM EST  Reason for Triage: Concerned about BP 179/77. On yesterday BP was 182/82, 158/75 with dizziness   Reason for Disposition  [1] Systolic BP >= 160 OR Diastolic >= 100 AND [2] cardiac (e.g., breathing difficulty, chest pain) or neurologic symptoms (e.g., new-onset blurred or double vision, unsteady gait)  Answer Assessment - Initial Assessment Questions Patient called and advised that yesterday morning she felt bad when she woke up--felt very tired She states she felt dizzy when she got up She states she took her blood pressure and it was 182/82---she took one baby Aspirin, her regular medications and rested Later yesterday afternoon blood pressure went to 158/72  Patient states this morning she woke up and felt a little better --she ate breakfast -- 179/77 and this was taken 20 minutes prior to triage with this RN  During Triage with this RN: 169/71 with a heart rate of 64 at 9:03am  Patient did add some life stressors being a family member going through health concerns two days ago  Patient does take blood pressure medication--she states three of them  Patient denies chest pain, difficulty breathing, dizziness during triage  She states yesterday she did have the dizziness and felt really bad  Patient is advised that  given her symptoms and even though the dizziness is not present today, her blood pressure is still elevated despite regular medication it is recommended that she goes to the Emergency Room at this time Patient declines wanting to do this due to having to wait at the ER she states She wants a message sent to her provider and to be seen in office---she states something similar happened in the past and her blood pressure medication was adjusted at that time  Patient is advised to call us  back if anything changes or with any further questions/concerns. Patient is advised that if anything worsens to go to the Emergency Room or call 911. Patient verbalized understanding.  Protocols used: Blood Pressure - High-A-AH

## 2024-07-20 NOTE — ED Provider Notes (Signed)
 " [facility]ELT    CSN: [id number] Arrival date & time: 07/20/24  1405      History   Chief Complaint Chief Complaint  Patient presents with   Hypertension    HPI Claire Lewis is a 79 y.o. female.   Patient presents for evaluation of elevated blood pressure readings at home beginning 2 days ago.  Patient endorses she had an episode of dizziness while standing which prompted her to check blood pressure at that time systolically in the 182/82.  Blood pressure remaining elevated for the remainder of the day.  Recheck blood pressure today after taking medicine around 9:30 AM and blood pressure was 179/77, rechecked after 1-1/2 hours and it was 166/69, notified primary doctor who instructed ED/urgent care evaluation.  Denying chest pain, shortness of breath, headache, dizziness, blurred vision.  Taking daily amlodipine  and metoprolol  without missing dosages.\Denies dietary changes or increase in salt intake.  Endorses increased stress 3 days ago while waiting for son-in-law to have heart surgery.       Past Medical History:  Diagnosis Date   Allergy    Cataract 2020   Had surgery 2020   GERD (gastroesophageal reflux disease)    Hypertension     Patient Active Problem List   Diagnosis Date Noted   Osteopenia 08/21/2022   Prediabetes 02/08/2019   Obesity (BMI 30.0-34.9) 11/07/2014   Pure hypercholesterolemia 05/24/2009   Essential hypertension, benign 01/03/2009   ALLERGIC RHINITIS 01/03/2009   GERD (gastroesophageal reflux disease) 01/03/2009   OTHER LICHEN NOT ELSEWHERE CLASSIFIED 01/03/2009   History of colonic polyps 01/03/2009    Past Surgical History:  Procedure Laterality Date   ABDOMINAL HYSTERECTOMY  1982   Approximate   CATARACT EXTRACTION, BILATERAL  04/2019   EYE SURGERY  2020   PARTIAL HYSTERECTOMY  1983   one ovary remains for endometriosis   TUBAL LIGATION  1977    OB History   No obstetric history on file.      Home Medications     Prior to Admission medications  Medication Sig Start Date End Date Taking? Authorizing Provider  amLODipine  (NORVASC ) 10 MG tablet Take 1 tablet (10 mg total) by mouth daily. 04/26/24   Bedsole, Amy E, MD  atorvastatin  (LIPITOR) 20 MG tablet Take 1 tablet (20 mg total) by mouth daily. 04/26/24   Bedsole, Amy E, MD  Calcium  Carbonate-Vitamin D 600-400 MG-UNIT tablet Take 1 tablet by mouth daily.    [provider]  estradiol  (ESTRACE ) 0.5 MG tablet Take 0.5 mg by mouth once a week.    [provider]  halobetasol  (ULTRAVATE ) 0.05 % ointment APPLY TO AFFECTED AREA TWICE A DAY 04/19/24   Bedsole, Amy E, MD  latanoprost (XALATAN) 0.005 % ophthalmic solution 1 drop at bedtime. 02/07/22   [provider]  metoprolol  succinate (TOPROL -XL) 50 MG 24 hr tablet Take 1 tablet (50 mg total) by mouth daily. 04/26/24   Bedsole, Amy E, MD  Omega-3 Fatty Acids (FISH OIL) 1200 MG CAPS Take 1 tablet by mouth daily.    [provider]  omeprazole  (PRILOSEC) 40 MG capsule Take 1 capsule (40 mg total) by mouth daily. 01/07/24       Family History Family History  Problem Relation Age of Onset   Heart attack Father 82   Coronary artery disease Father    Cancer Mother        breast   Dementia Mother    Breast cancer Mother    Cancer Brother  colon   Cancer Paternal Grandmother        lung   Breast cancer Paternal Grandmother    Meniere's disease Daughter    Cancer Daughter     Social History Social History[1]   Allergies   Patient has no known allergies.   Review of Systems Review of Systems   Physical Exam Triage Vital Signs ED Triage Vitals  Encounter Vitals Group     BP 07/20/24 1424 (!) 134/58     Girls Systolic BP Percentile --      Girls Diastolic BP Percentile --      Boys Systolic BP Percentile --      Boys Diastolic BP Percentile --      Pulse Rate 07/20/24 1424 69     Resp 07/20/24 1424 20     Temp 07/20/24 1424 98.1 F (36.7 C)      Temp Source 07/20/24 1424 Oral     SpO2 07/20/24 1424 97 %     Weight --      Height --      Head Circumference --      Peak Flow --      Pain Score 07/20/24 1425 0     Pain Loc --      Pain Education --      Exclude from Growth Chart --    No data found.  Updated Vital Signs BP (!) 134/58 (BP Location: Right Arm)   Pulse 69   Temp 98.1 F (36.7 C) (Oral)   Resp 20   SpO2 97%   Visual Acuity Right Eye Distance:   Left Eye Distance:   Bilateral Distance:    Right Eye Near:   Left Eye Near:    Bilateral Near:     Physical Exam Constitutional:      Appearance: Normal appearance.  Eyes:     Extraocular Movements: Extraocular movements intact.  Cardiovascular:     Rate and Rhythm: Normal rate and regular rhythm.     Pulses: Normal pulses.     Heart sounds: Normal heart sounds.  Pulmonary:     Effort: Pulmonary effort is normal.     Breath sounds: Normal breath sounds.  Neurological:     General: No focal deficit present.     Mental Status: She is alert and oriented to person, place, and time.      UC Treatments / Results  Labs (all labs ordered are listed, but only abnormal results are displayed) Labs Reviewed - No data to display  EKG   Radiology No results found.  Procedures Procedures (including critical care time)  Medications Ordered in UC Medications - No data to display  Initial Impression / Assessment and Plan / UC Course  I have reviewed the triage vital signs and the nursing notes.  Pertinent labs & imaging results that were available during my care of the patient were reviewed by me and considered in my medical decision making (see chart for details).  Elevated blood pressure reading with diagnosis of hypertension  Blood pressure in triage 134/58, taken twice by nursing staff, S1 and S2 heard to auscultation EKG showing normal sinus rhythm with a first-degree AV block, no prior EKG for comparison, discussed this with patient, at this time  stable and will not alter medication advised to monitor blood pressure daily until seen by PCP recommended PCP for follow-up, given strict ER precautions of blood pressure becomes elevated again if she is symptomatic Final Clinical Impressions(s) / UC Diagnoses  Final diagnoses:  None   Discharge Instructions   None    ED Prescriptions   None    PDMP not reviewed this encounter.     [1]  Social History Tobacco Use   Smoking status: Never   Smokeless tobacco: Never  Vaping Use   Vaping status: Never Used  Substance Use Topics   Alcohol use: No   Drug use: No     Teresa Shelba SAUNDERS, NP 07/20/24 1507  "

## 2024-07-20 NOTE — Discharge Instructions (Addendum)
 Your evaluated for a elevated blood pressure however here in the clinic blood pressure is 134/58 which is in your normal range  You currently do not have any symptoms such as  chest pain shortness of breath, blurred vision, severe headache, dizziness  EKG shows his heart is beating in a regular pace and rhythm and is not showing anything that would be emergent or alarming  Please notify your doctor and schedule follow-up appointment so that they may recheck your blood pressure and make adjustments to your medicine if they deem necessary  Avoid high levels of salt intake  At any point if your blood pressure was to become elevated again and you become symptomatic with any of the following above please go to the nearest emergency department for evaluation

## 2024-07-20 NOTE — ED Triage Notes (Signed)
 Patient reports High blood pressure that started yesterday. Patient reports dizziness on yesterday. Patient denies CP, SOB or dizziness today.  Tuesday BP at was  182/82, 169/69, 163/71, 160/69, 158/75 and today BP at home was 179/77, 166/69. Patient was told to go ED by nurse because they did not have any appointments.

## 2024-07-20 NOTE — Telephone Encounter (Signed)
 Called and CAL & advised Renee of patient's symptoms and ER Refusal at this time

## 2024-07-20 NOTE — Telephone Encounter (Signed)
 Noted.

## 2024-07-20 NOTE — Telephone Encounter (Signed)
 Copied from CRM [id number]. Topic: General - Other >> Jul 20, 2024  4:00 PM Rachelle R wrote: Reason for CRM: Patient went to UC today for her BP and was told to call back and provide an update, states that everything was good, when they took her BP is was 134/58, took it twice. Also did an EKG and had no issues. Confirmed she is having not currently any symptoms and thinks maybe her BP cuff is not working properly.  Patient can be reached at [phone]

## 2024-07-20 NOTE — Telephone Encounter (Signed)
 Claire Greig BRAVO, MD to [facility] Triage      07/20/24 11:52 AM  If pt BP persistently up.. and not wiling to go to urgent care today at least... recommend follow up in next few days with me. ER if neurologic symptoms. Also update her med list as note says she is on three BP meds and I only see amlodipine  and metoprolol .. If heart rate above 65 and BP > 140/90 she could take an additional  metoprolol  XL 50 mg daily until OV.    I spoke with pt and pt notified as instructed by Dr Claire. Pt voiced understanding and said today she has no CP,SOB, dizziness, vision changes or H/A. Pt said earlier today BP 179/77 P? And at 11:30 BP was 166/69 P?. Pt said on 07/18/24 was at hospital for over 8 hrs while son inlaw had heart surgery and that upset pt. Pt is only taking 2 BP meds. Amlodipine  10 mg one daily and Metoprolol  50 mg taking one daily. Pt takes both meds around breakfast. No available appts today at [facility] and with possible weather issues pt is going to change clothes now and go to [employer]. Pt will cb with update on how she is doing tomorrow.Sending note to Dr Claire and [place] pool. Will teams Amy CMA.

## 2024-07-21 NOTE — Telephone Encounter (Signed)
 Noted.

## 2025-04-13 ENCOUNTER — Ambulatory Visit

## 2025-04-14 ENCOUNTER — Ambulatory Visit

## 2025-04-20 ENCOUNTER — Other Ambulatory Visit

## 2025-04-27 ENCOUNTER — Encounter: Admitting: Family Medicine
